# Patient Record
Sex: Male | Born: 1950 | Race: White | Hispanic: No | Marital: Married | Smoking: Never smoker
Health system: Southern US, Community
[De-identification: ages and names within clinical notes are randomized; demographics above are authoritative.]

## PROBLEM LIST (undated history)

## (undated) DIAGNOSIS — E785 Hyperlipidemia, unspecified: Secondary | ICD-10-CM

## (undated) DIAGNOSIS — C801 Malignant (primary) neoplasm, unspecified: Secondary | ICD-10-CM

## (undated) DIAGNOSIS — J069 Acute upper respiratory infection, unspecified: Secondary | ICD-10-CM

## (undated) DIAGNOSIS — M1990 Unspecified osteoarthritis, unspecified site: Secondary | ICD-10-CM

## (undated) DIAGNOSIS — M109 Gout, unspecified: Secondary | ICD-10-CM

## (undated) DIAGNOSIS — G4730 Sleep apnea, unspecified: Secondary | ICD-10-CM

## (undated) DIAGNOSIS — E669 Obesity, unspecified: Secondary | ICD-10-CM

## (undated) DIAGNOSIS — N400 Enlarged prostate without lower urinary tract symptoms: Secondary | ICD-10-CM

## (undated) DIAGNOSIS — L500 Allergic urticaria: Secondary | ICD-10-CM

## (undated) DIAGNOSIS — K5190 Ulcerative colitis, unspecified, without complications: Secondary | ICD-10-CM

## (undated) DIAGNOSIS — Z87442 Personal history of urinary calculi: Secondary | ICD-10-CM

## (undated) DIAGNOSIS — R17 Unspecified jaundice: Secondary | ICD-10-CM

## (undated) DIAGNOSIS — I10 Essential (primary) hypertension: Secondary | ICD-10-CM

## (undated) DIAGNOSIS — N529 Male erectile dysfunction, unspecified: Secondary | ICD-10-CM

## (undated) DIAGNOSIS — K429 Umbilical hernia without obstruction or gangrene: Secondary | ICD-10-CM

## (undated) HISTORY — PX: TONSILLECTOMY: SUR1361

## (undated) HISTORY — DX: Allergic urticaria: L50.0

## (undated) HISTORY — PX: SINOSCOPY: SHX187

## (undated) HISTORY — DX: Acute upper respiratory infection, unspecified: J06.9

## (undated) HISTORY — PX: OTHER SURGICAL HISTORY: SHX169

---

## 1999-04-04 ENCOUNTER — Encounter: Admission: RE | Admit: 1999-04-04 | Discharge: 1999-05-07 | Payer: Self-pay | Admitting: Emergency Medicine

## 1999-04-23 ENCOUNTER — Encounter: Payer: Self-pay | Admitting: Emergency Medicine

## 1999-04-23 ENCOUNTER — Encounter: Admission: RE | Admit: 1999-04-23 | Discharge: 1999-04-23 | Payer: Self-pay | Admitting: Emergency Medicine

## 2000-07-09 ENCOUNTER — Ambulatory Visit (HOSPITAL_COMMUNITY): Admission: RE | Admit: 2000-07-09 | Discharge: 2000-07-09 | Payer: Self-pay | Admitting: Gastroenterology

## 2002-02-06 ENCOUNTER — Encounter: Payer: Self-pay | Admitting: Emergency Medicine

## 2002-02-06 ENCOUNTER — Emergency Department (HOSPITAL_COMMUNITY): Admission: EM | Admit: 2002-02-06 | Discharge: 2002-02-06 | Payer: Self-pay | Admitting: Emergency Medicine

## 2002-06-10 ENCOUNTER — Ambulatory Visit (HOSPITAL_BASED_OUTPATIENT_CLINIC_OR_DEPARTMENT_OTHER): Admission: RE | Admit: 2002-06-10 | Discharge: 2002-06-10 | Payer: Self-pay | Admitting: Emergency Medicine

## 2002-08-15 ENCOUNTER — Ambulatory Visit (HOSPITAL_COMMUNITY): Admission: RE | Admit: 2002-08-15 | Discharge: 2002-08-15 | Payer: Self-pay | Admitting: Gastroenterology

## 2003-09-04 ENCOUNTER — Ambulatory Visit (HOSPITAL_COMMUNITY): Admission: RE | Admit: 2003-09-04 | Discharge: 2003-09-04 | Payer: Self-pay | Admitting: Gastroenterology

## 2004-01-01 ENCOUNTER — Emergency Department (HOSPITAL_COMMUNITY): Admission: EM | Admit: 2004-01-01 | Discharge: 2004-01-01 | Payer: Self-pay | Admitting: Emergency Medicine

## 2004-05-17 ENCOUNTER — Ambulatory Visit: Payer: Self-pay | Admitting: *Deleted

## 2004-05-20 ENCOUNTER — Ambulatory Visit (HOSPITAL_COMMUNITY): Admission: RE | Admit: 2004-05-20 | Discharge: 2004-05-21 | Payer: Self-pay | Admitting: Otolaryngology

## 2004-06-05 ENCOUNTER — Ambulatory Visit: Payer: Self-pay | Admitting: Pulmonary Disease

## 2004-06-06 ENCOUNTER — Ambulatory Visit: Payer: Self-pay

## 2004-06-17 ENCOUNTER — Ambulatory Visit: Payer: Self-pay | Admitting: *Deleted

## 2004-10-21 ENCOUNTER — Ambulatory Visit (HOSPITAL_COMMUNITY): Admission: RE | Admit: 2004-10-21 | Discharge: 2004-10-21 | Payer: Self-pay | Admitting: Gastroenterology

## 2012-12-20 ENCOUNTER — Other Ambulatory Visit: Payer: Self-pay | Admitting: Gastroenterology

## 2013-02-15 ENCOUNTER — Ambulatory Visit (HOSPITAL_COMMUNITY)
Admission: RE | Admit: 2013-02-15 | Discharge: 2013-02-15 | Disposition: A | Discharge status: Home / Self Care | Payer: 59 | Source: Ambulatory Visit | Attending: Gastroenterology | Admitting: Gastroenterology

## 2013-02-15 ENCOUNTER — Encounter (HOSPITAL_COMMUNITY)
Admission: RE | Disposition: A | Discharge status: Home / Self Care | Payer: Self-pay | Source: Ambulatory Visit | Attending: Gastroenterology

## 2013-02-15 ENCOUNTER — Encounter (HOSPITAL_COMMUNITY): Payer: Self-pay

## 2013-02-15 DIAGNOSIS — E7800 Pure hypercholesterolemia, unspecified: Secondary | ICD-10-CM | POA: Insufficient documentation

## 2013-02-15 DIAGNOSIS — N400 Enlarged prostate without lower urinary tract symptoms: Secondary | ICD-10-CM | POA: Insufficient documentation

## 2013-02-15 DIAGNOSIS — K5100 Ulcerative (chronic) pancolitis without complications: Secondary | ICD-10-CM | POA: Insufficient documentation

## 2013-02-15 DIAGNOSIS — I10 Essential (primary) hypertension: Secondary | ICD-10-CM | POA: Insufficient documentation

## 2013-02-15 DIAGNOSIS — K5730 Diverticulosis of large intestine without perforation or abscess without bleeding: Secondary | ICD-10-CM | POA: Insufficient documentation

## 2013-02-15 DIAGNOSIS — M109 Gout, unspecified: Secondary | ICD-10-CM | POA: Insufficient documentation

## 2013-02-15 DIAGNOSIS — D126 Benign neoplasm of colon, unspecified: Secondary | ICD-10-CM | POA: Insufficient documentation

## 2013-02-15 DIAGNOSIS — D371 Neoplasm of uncertain behavior of stomach: Secondary | ICD-10-CM | POA: Insufficient documentation

## 2013-02-15 HISTORY — PX: COLONOSCOPY: SHX5424

## 2013-02-15 HISTORY — DX: Male erectile dysfunction, unspecified: N52.9

## 2013-02-15 HISTORY — DX: Gout, unspecified: M10.9

## 2013-02-15 HISTORY — DX: Obesity, unspecified: E66.9

## 2013-02-15 HISTORY — DX: Hyperlipidemia, unspecified: E78.5

## 2013-02-15 HISTORY — DX: Ulcerative colitis, unspecified, without complications: K51.90

## 2013-02-15 HISTORY — DX: Essential (primary) hypertension: I10

## 2013-02-15 HISTORY — DX: Benign prostatic hyperplasia without lower urinary tract symptoms: N40.0

## 2013-02-15 SURGERY — COLONOSCOPY
Anesthesia: Moderate Sedation

## 2013-02-15 MED ORDER — MIDAZOLAM HCL 10 MG/2ML IJ SOLN
INTRAMUSCULAR | Status: AC
  Filled 2013-02-15: qty 2

## 2013-02-15 MED ORDER — MIDAZOLAM HCL 5 MG/5ML IJ SOLN
INTRAMUSCULAR | Status: DC | PRN
  Administered 2013-02-15 (×2): 2.5 mg via INTRAVENOUS

## 2013-02-15 MED ORDER — FENTANYL CITRATE 0.05 MG/ML IJ SOLN
INTRAMUSCULAR | Status: DC | PRN
  Administered 2013-02-15: 25 ug via INTRAVENOUS
  Administered 2013-02-15: 50 ug via INTRAVENOUS

## 2013-02-15 MED ORDER — FENTANYL CITRATE 0.05 MG/ML IJ SOLN
INTRAMUSCULAR | Status: AC
  Filled 2013-02-15: qty 2

## 2013-02-15 NOTE — H&P (Signed)
  Problem: Universal ulcerative colitis since age 62.  History: The patient is a 62 year old male born 08-Nov-1950. 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 His disease has remained in remission for years. He underwent a normal surveillance colonoscopies in 2008, 2009, and 2010.  Patient is scheduled to undergo a surveillance colonoscopy with performance of surveillance mucosal biopsies to rule out mucosal dysplasia.  Allergies: Vytorin causes muscle aches associated with an increase in the CPK  Past medical history: Universal ulcerative colitis since age 15. Hypertension. Gout. Hypercholesterolemia. Benign prostatic hypertrophy. Seborrheic dermatitis. Deviated nasal septum surgery. Tonsillectomy.  Exam: The patient is alert and lying comfortably on the endoscopy stretcher. Lungs are clear to auscultation. Cardiac exam reveals a regular rhythm. Abdomen is soft and nontender to palpation  Plan: Proceed with surveillance colonoscopy.

## 2013-02-15 NOTE — Op Note (Signed)
Problem: Universal ulcerative colitis diagnosed at age 62  Endoscopist: Danise Edge  Premedication: Versed 5 mg. Fentanyl 75 mcg.  Procedure: Surveillance colonoscopy The patient was placed in the left lateral decubitus position. Anal inspection and digital rectal exam were normal. The Pentax pediatric colonoscope was introduced into the rectum and easily advanced to the cecum. A normal-appearing ileocecal valve and appendiceal orifice were identified. Colonic preparation for the exam today was good.  There was universal colonic diverticulosis without signs of diverticulitis  Rectum. Normal. Retroflexed view of the distal rectum normal.  Sigmoid colon and descending colon. Normal.  Splenic flexure. Normal.  Transverse colon. Normal.  Hepatic flexure. Normal.  Ascending colon. Five 3 mm sized sessile polyps were removed with the cold biopsy forceps and submitted in one bottle for pathological evaluation.  Cecum and ileocecal valve. Normal.  Surveillance colonic biopsies. A total of 32 biopsies were performed along the length of the colon to look for mucosal dysplasia.  Assessment:  #1. Inactive universal ulcerative colitis   #2. Universal colonic diverticulosis  #3. Five 3 mm sized sessile polyps were removed from the ascending colon  #4. Random colon biopsies to rule out mucosal dysplasia performed

## 2013-02-16 ENCOUNTER — Encounter (HOSPITAL_COMMUNITY): Payer: Self-pay | Admitting: Gastroenterology

## 2017-06-22 DIAGNOSIS — M109 Gout, unspecified: Secondary | ICD-10-CM | POA: Diagnosis present

## 2017-06-22 DIAGNOSIS — M1611 Unilateral primary osteoarthritis, right hip: Secondary | ICD-10-CM | POA: Diagnosis present

## 2017-06-22 DIAGNOSIS — M19011 Primary osteoarthritis, right shoulder: Secondary | ICD-10-CM | POA: Diagnosis present

## 2017-06-22 DIAGNOSIS — I10 Essential (primary) hypertension: Secondary | ICD-10-CM | POA: Diagnosis present

## 2017-06-22 DIAGNOSIS — M1711 Unilateral primary osteoarthritis, right knee: Secondary | ICD-10-CM | POA: Diagnosis present

## 2017-06-22 NOTE — H&P (Signed)
PREOPERATIVE H&P Patient ID: Joseph Massey MRN: [id number] DOB/AGE: [birth year] 67 y.o.  Chief Complaint: OA RIGHT HIP  Planned Procedure Date: 07/14/2017 Medical Clearance by Dr. Jacelyn Grip pending Cardiac Clearance by Dr. Wynonia Lawman pending   HPI: Joseph Massey is a 67 y.o. male with a remote history of ulcerative colitis.  History of OSA on CPAP, gout, HTN, severe right shoulder OA, right knee OA.  He presents today for evaluation of OA RIGHT HIP. The patient has a history of pain and functional disability in the right hip due to arthritis and has failed non-surgical conservative treatments for greater than 12 weeks to include NSAID's and/or analgesics, corticosteriod injections, use of assistive devices and activity modification.  Onset of symptoms was gradual, starting 1 years ago with rapidlly worsening course since that time.  Patient currently rates pain at 8 out of 10 with activity. Patient has night pain, worsening of pain with activity and weight bearing and pain that interferes with activities of daily living.  Patient has evidence of subchondral cysts, subchondral sclerosis, periarticular osteophytes and joint space narrowing by imaging studies.  There is no active infection.  Past Medical History:  Diagnosis Date  . BPH (benign prostatic hyperplasia)   . ED (erectile dysfunction)   . Gout   . Hyperlipidemia   . Hypertension   . Obesity   . UC (ulcerative colitis)    Past Surgical History:  Procedure Laterality Date  . COLONOSCOPY N/A 02/15/2013   Procedure: COLONOSCOPY;  Surgeon: Garlan Fair, MD;  Location: [facility];  Service: Endoscopy;  Laterality: N/A;   Allergies  Allergen Reactions  . Vytorin [Ezetimibe-Simvastatin]    Medications: Lisinopril/HCTZ 20-12.5 mg daily Allopurinol 300 mg daily. Doxazosin 1 daily. Ibuprofen 800 mg up to 3 times daily as needed Pepcid AC daily  Social history: Married traveling Merchandiser, retail.  Never smoker.  Daily EtOH-2-3 drinks  per day.  Family history: Mother with cancer.  Father with heart disease, MI, DM.  Grandparents with cancer.  ROS: Currently denies lightheadedness, dizziness, Fever, chills, CP, SOB.   No personal history of DVT, PE, MI, or CVA. No loose teeth or dentures All other systems have been reviewed and were otherwise currently negative with the exception of those mentioned in the HPI and as above.  Objective: Vitals: Ht: 5 feet 8-1/2 inches wt: 237 temp: 97.8 BP: 124/71 pulse: 68 O2 97 % on room air. Physical Exam: General: Alert, NAD.  Trendelenberg Gait  HEENT: EOMI, Good Neck Extension  Pulm: No increased work of breathing.  Clear B/L A/P w/o crackle or wheeze.  CV: RRR, No m/g/r appreciated  GI: soft, NT, ND Neuro: Neuro without gross focal deficit.  Sensation intact distally Skin: No lesions in the area of chief complaint MSK/Surgical Site: Right hip Non tender over greater trochanter.  Pain with passive ROM.  Positive Stinchfield.  5/5 strength.  NVI.  Sensation intact distally.   Imaging Review Plain radiographs demonstrate severe degenerative joint disease of the right hip.   Assessment: OA RIGHT HIP Principal Problem:   Primary osteoarthritis of right hip Active Problems:   Primary osteoarthritis, right shoulder   Primary osteoarthritis of right knee   Gout   Essential hypertension   Plan: Plan for Procedure(s): TOTAL HIP ARTHROPLASTY ANTERIOR APPROACH  The patient history, physical exam, clinical judgement of the provider and imaging are consistent with end stage degenerative joint disease and total joint arthroplasty is deemed medically necessary. The treatment options including medical management, injection therapy, and  arthroplasty were discussed at length. The risks and benefits of Procedure(s): TOTAL HIP ARTHROPLASTY ANTERIOR APPROACH were presented and reviewed.  The risks of nonoperative treatment, versus surgical intervention including but not limited to  continued pain, aseptic loosening, stiffness, dislocation/subluxation, infection, bleeding, nerve injury, blood clots, cardiopulmonary complications, morbidity, mortality, among others were discussed. The patient verbalizes understanding and wishes to proceed with the plan.  Patient is being admitted for inpatient treatment for surgery, pain control, PT, OT, prophylactic antibiotics, VTE prophylaxis, progressive ambulation, ADL's and discharge planning.   Dental prophylaxis discussed and recommended for 2 years postoperatively.   The patient does meet the criteria for TXA which will be used perioperatively.    ASA 325 mg will be used postoperatively for DVT prophylaxis in addition to SCDs, and early ambulation.  Will make Rx for Celebrex / gastroprotection post op.  The patient is planning to be discharged home with home health services ([facility]) in care of his wife.   Prudencio Burly III, PA-C 06/22/2017 6:24 PM

## 2017-07-01 ENCOUNTER — Other Ambulatory Visit (HOSPITAL_COMMUNITY): Payer: Self-pay

## 2017-07-02 NOTE — Pre-Procedure Instructions (Signed)
Joseph Massey  07/02/2017      [id number] - [place], [place] [address] [place] [address] Phone: [phone] Fax: [phone]    Your procedure is scheduled on Tuesday, July 14, 2017  Report to [facility] Admitting Entrance "A" at 10:00AM   Call this number if you have problems the morning of surgery:  [phone]   Remember:  Do not eat food or drink liquids after midnight.  Take these medicines the morning of surgery with A SIP OF WATER:  Allopurinol (ZYLOPRIM) and Doxazosin (CARDURA). If needed Acetaminophen (TYLENOL) for pain.  7 days before surgery (Feb. 5), stop taking all Aspirins, Vitamins, Fish oils, and Herbal medications. Also stop all NSAIDS i.e. Advil, Ibuprofen, Motrin, Aleve, Anaprox, Naproxen, BC and Goody Powders.   Do not wear jewelry.  Do not wear lotions, powders, colognes, or deodorant.  Do not shave 48 hours prior to surgery.  Men may shave face and neck.  Do not bring valuables to the hospital.  [facility] is not responsible for any belongings or valuables.  Contacts, dentures or bridgework may not be worn into surgery.  Leave your suitcase in the car.  After surgery it may be brought to your room.  For patients admitted to the hospital, discharge time will be determined by your treatment team.  Patients discharged the day of surgery will not be allowed to drive home.   Special instructions:   [place]- Preparing For Surgery  Before surgery, you can play an important role. Because skin is not sterile, your skin needs to be as free of germs as possible. You can reduce the number of germs on your skin by washing with CHG (chlorahexidine gluconate) Soap before surgery.  CHG is an antiseptic cleaner which kills germs and bonds with the skin to continue killing germs even after washing.  Please do not use if you have an allergy to CHG or antibacterial soaps. If your skin becomes reddened/irritated  stop using the CHG.  Do not shave (including legs and underarms) for at least 48 hours prior to first CHG shower. It is OK to shave your face.  Please follow these instructions carefully.   1. Shower the NIGHT BEFORE SURGERY and the MORNING OF SURGERY with CHG.   2. If you chose to wash your hair, wash your hair first as usual with your normal shampoo.  3. After you shampoo, rinse your hair and body thoroughly to remove the shampoo.  4. Use CHG as you would any other liquid soap. You can apply CHG directly to the skin and wash gently with a scrungie or a clean washcloth.   5. Apply the CHG Soap to your body ONLY FROM THE NECK DOWN.  Do not use on open wounds or open sores. Avoid contact with your eyes, ears, mouth and genitals (private parts). Wash Face and genitals (private parts)  with your normal soap.  6. Wash thoroughly, paying special attention to the area where your surgery will be performed.  7. Thoroughly rinse your body with warm water from the neck down.  8. DO NOT shower/wash with your normal soap after using and rinsing off the CHG Soap.  9. Pat yourself dry with a CLEAN TOWEL.  10. Wear CLEAN PAJAMAS to bed the night before surgery, wear comfortable clothes the morning of surgery  11. Place CLEAN SHEETS on your bed the night of your first shower and DO NOT SLEEP WITH PETS.  Day of Surgery: Do not apply any deodorants/lotions. Please wear clean clothes to the hospital/surgery center.    Please read over the following fact sheets that you were given. Pain Booklet, Coughing and Deep Breathing, Total Joint Packet, MRSA Information and Surgical Site Infection Prevention

## 2017-07-03 ENCOUNTER — Encounter (HOSPITAL_COMMUNITY): Payer: Self-pay

## 2017-07-03 ENCOUNTER — Other Ambulatory Visit: Payer: Self-pay

## 2017-07-03 ENCOUNTER — Encounter (HOSPITAL_COMMUNITY)
Admission: RE | Admit: 2017-07-03 | Discharge: 2017-07-03 | Disposition: A | Discharge status: Home / Self Care | Payer: Medicare Other | Source: Ambulatory Visit | Attending: Orthopedic Surgery | Admitting: Orthopedic Surgery

## 2017-07-03 DIAGNOSIS — Z01818 Encounter for other preprocedural examination: Secondary | ICD-10-CM | POA: Diagnosis not present

## 2017-07-03 DIAGNOSIS — M1611 Unilateral primary osteoarthritis, right hip: Secondary | ICD-10-CM | POA: Insufficient documentation

## 2017-07-03 HISTORY — DX: Sleep apnea, unspecified: G47.30

## 2017-07-03 HISTORY — DX: Personal history of urinary calculi: Z87.442

## 2017-07-03 HISTORY — DX: Unspecified osteoarthritis, unspecified site: M19.90

## 2017-07-03 LAB — BASIC METABOLIC PANEL
Anion gap: 9 (ref 5–15)
BUN: 13 mg/dL (ref 6–20)
CALCIUM: 9.1 mg/dL (ref 8.9–10.3)
CO2: 25 mmol/L (ref 22–32)
CREATININE: 0.72 mg/dL (ref 0.61–1.24)
Chloride: 103 mmol/L (ref 101–111)
Glucose, Bld: 102 mg/dL — ABNORMAL HIGH (ref 65–99)
Potassium: 4 mmol/L (ref 3.5–5.1)
SODIUM: 137 mmol/L (ref 135–145)

## 2017-07-03 LAB — SURGICAL PCR SCREEN
MRSA, PCR: NEGATIVE
STAPHYLOCOCCUS AUREUS: NEGATIVE

## 2017-07-03 LAB — CBC
HCT: 41.1 % (ref 39.0–52.0)
Hemoglobin: 14 g/dL (ref 13.0–17.0)
MCH: 32 pg (ref 26.0–34.0)
MCHC: 34.1 g/dL (ref 30.0–36.0)
MCV: 94.1 fL (ref 78.0–100.0)
PLATELETS: 228 10*3/uL (ref 150–400)
RBC: 4.37 MIL/uL (ref 4.22–5.81)
RDW: 14.2 % (ref 11.5–15.5)
WBC: 5.6 10*3/uL (ref 4.0–10.5)

## 2017-07-03 NOTE — Progress Notes (Signed)
PCP: Dr. Jacelyn Grip @ [facility]  [place] Cardiologist: Dr. Wynonia Lawman

## 2017-07-06 NOTE — Progress Notes (Addendum)
Anesthesia Chart Review:  Pt is a 67 year old male scheduled for R total hip arthroplasty anterior approach on 07/14/2017 with Edmonia Lynch, MD  - Receives primary care at [facility].  Last office visit 04/03/17 with Dineen Kid, MD - Cardiologist is Tollie Eth, MD. Last office visit 05/08/17  PMH includes:  HTN, hyperlipidemia, OSA, ulcerative colitis. Never smoker. BMI 33  Medications include: cardura, lisinopril-hctz  BP 126/69   Pulse 71   Temp 36.6 C   Resp 20   Ht 5\' 11"  (1.803 m)   Wt 238 lb 6.4 oz (108.1 kg)   SpO2 95%   BMI 33.25 kg/m   Preoperative labs reviewed.    EKG 05/08/17 (Dr. Thurman Coyer office): First-degree AV block, occasional PVCs, nonspecific ST changes, previous septal infarction.  Echo 05/22/17 (Dr. Thurman Coyer office): 1.  Mild concentric LVH with normal global wall motion.  Estimated EF 65%. 2.  Mild mitral regurgitation. 3.  Trace tricuspid regurgitation. 4.  IVC is dilated with respiratory variation  If no changes, I anticipate pt can proceed with surgery as scheduled.   Willeen Cass, FNP-BC [facility] Short Stay Surgical Center/Anesthesiology Phone: [phone] 07/07/2017 4:35 PM

## 2017-07-13 MED ORDER — BUPIVACAINE LIPOSOME 1.3 % IJ SUSP
20.0000 mL | INTRAMUSCULAR | Status: AC
  Administered 2017-07-14: 20 mL
  Filled 2017-07-13: qty 20

## 2017-07-13 MED ORDER — TRANEXAMIC ACID 1000 MG/10ML IV SOLN
1000.0000 mg | INTRAVENOUS | Status: AC
  Administered 2017-07-14: 1000 mg via INTRAVENOUS
  Filled 2017-07-13: qty 1100

## 2017-07-13 MED ORDER — SODIUM CHLORIDE 0.9 % IV SOLN
2000.0000 mg | Freq: Once | INTRAVENOUS | Status: AC
  Administered 2017-07-14: 2000 mg via TOPICAL
  Filled 2017-07-13: qty 20

## 2017-07-14 ENCOUNTER — Inpatient Hospital Stay (HOSPITAL_COMMUNITY): Payer: Medicare Other | Admitting: Emergency Medicine

## 2017-07-14 ENCOUNTER — Encounter (HOSPITAL_COMMUNITY): Payer: Self-pay | Admitting: *Deleted

## 2017-07-14 ENCOUNTER — Encounter (HOSPITAL_COMMUNITY)
Admission: RE | Disposition: A | Discharge status: Home Health | Payer: Self-pay | Source: Ambulatory Visit | Attending: Orthopedic Surgery

## 2017-07-14 ENCOUNTER — Inpatient Hospital Stay (HOSPITAL_COMMUNITY): Payer: Medicare Other

## 2017-07-14 ENCOUNTER — Inpatient Hospital Stay (HOSPITAL_COMMUNITY): Payer: Medicare Other | Admitting: Certified Registered"

## 2017-07-14 ENCOUNTER — Inpatient Hospital Stay (HOSPITAL_COMMUNITY)
Admission: RE | Admit: 2017-07-14 | Discharge: 2017-07-15 | DRG: 470 | Disposition: A | Discharge status: Home Health | Payer: Medicare Other | Source: Ambulatory Visit | Attending: Orthopedic Surgery | Admitting: Orthopedic Surgery

## 2017-07-14 DIAGNOSIS — Z87442 Personal history of urinary calculi: Secondary | ICD-10-CM | POA: Diagnosis not present

## 2017-07-14 DIAGNOSIS — M1711 Unilateral primary osteoarthritis, right knee: Secondary | ICD-10-CM | POA: Diagnosis present

## 2017-07-14 DIAGNOSIS — M19011 Primary osteoarthritis, right shoulder: Secondary | ICD-10-CM | POA: Diagnosis present

## 2017-07-14 DIAGNOSIS — I10 Essential (primary) hypertension: Secondary | ICD-10-CM | POA: Diagnosis present

## 2017-07-14 DIAGNOSIS — N400 Enlarged prostate without lower urinary tract symptoms: Secondary | ICD-10-CM | POA: Diagnosis present

## 2017-07-14 DIAGNOSIS — G4733 Obstructive sleep apnea (adult) (pediatric): Secondary | ICD-10-CM | POA: Diagnosis present

## 2017-07-14 DIAGNOSIS — Z419 Encounter for procedure for purposes other than remedying health state, unspecified: Secondary | ICD-10-CM

## 2017-07-14 DIAGNOSIS — E785 Hyperlipidemia, unspecified: Secondary | ICD-10-CM | POA: Diagnosis present

## 2017-07-14 DIAGNOSIS — E669 Obesity, unspecified: Secondary | ICD-10-CM | POA: Diagnosis present

## 2017-07-14 DIAGNOSIS — Z79899 Other long term (current) drug therapy: Secondary | ICD-10-CM | POA: Diagnosis not present

## 2017-07-14 DIAGNOSIS — M1611 Unilateral primary osteoarthritis, right hip: Principal | ICD-10-CM | POA: Diagnosis present

## 2017-07-14 DIAGNOSIS — Z6832 Body mass index (BMI) 32.0-32.9, adult: Secondary | ICD-10-CM

## 2017-07-14 DIAGNOSIS — M109 Gout, unspecified: Secondary | ICD-10-CM | POA: Diagnosis present

## 2017-07-14 DIAGNOSIS — M25551 Pain in right hip: Secondary | ICD-10-CM | POA: Diagnosis present

## 2017-07-14 HISTORY — PX: TOTAL HIP ARTHROPLASTY: SHX124

## 2017-07-14 SURGERY — ARTHROPLASTY, HIP, TOTAL, ANTERIOR APPROACH
Anesthesia: Spinal | Site: Hip | Laterality: Right

## 2017-07-14 MED ORDER — MIDAZOLAM HCL 2 MG/2ML IJ SOLN
INTRAMUSCULAR | Status: AC
  Filled 2017-07-14: qty 2

## 2017-07-14 MED ORDER — CHLORHEXIDINE GLUCONATE 4 % EX LIQD: 60.0000 mL | Freq: Once | CUTANEOUS | Status: DC

## 2017-07-14 MED ORDER — DEXAMETHASONE SODIUM PHOSPHATE 10 MG/ML IJ SOLN
INTRAMUSCULAR | Status: DC | PRN
  Administered 2017-07-14: 10 mg via INTRAVENOUS

## 2017-07-14 MED ORDER — METOCLOPRAMIDE HCL 5 MG PO TABS: 5.0000 mg | ORAL_TABLET | Freq: Three times a day (TID) | ORAL | Status: DC | PRN

## 2017-07-14 MED ORDER — METOCLOPRAMIDE HCL 5 MG/ML IJ SOLN: 5.0000 mg | Freq: Three times a day (TID) | INTRAMUSCULAR | Status: DC | PRN

## 2017-07-14 MED ORDER — ONDANSETRON HCL 4 MG PO TABS: 4.0000 mg | ORAL_TABLET | Freq: Four times a day (QID) | ORAL | Status: DC | PRN

## 2017-07-14 MED ORDER — ACETAMINOPHEN 325 MG PO TABS
650.0000 mg | ORAL_TABLET | ORAL | Status: DC | PRN
  Administered 2017-07-14: 650 mg via ORAL
  Filled 2017-07-14: qty 2

## 2017-07-14 MED ORDER — SODIUM CHLORIDE FLUSH 0.9 % IV SOLN
INTRAVENOUS | Status: DC | PRN
  Administered 2017-07-14: 50 mL

## 2017-07-14 MED ORDER — ACETAMINOPHEN 500 MG PO TABS
1000.0000 mg | ORAL_TABLET | Freq: Once | ORAL | Status: DC
  Filled 2017-07-14: qty 2

## 2017-07-14 MED ORDER — PHENYLEPHRINE 40 MCG/ML (10ML) SYRINGE FOR IV PUSH (FOR BLOOD PRESSURE SUPPORT)
PREFILLED_SYRINGE | INTRAVENOUS | Status: AC
  Filled 2017-07-14: qty 10

## 2017-07-14 MED ORDER — DOCUSATE SODIUM 100 MG PO CAPS: 100.0000 mg | ORAL_CAPSULE | Freq: Two times a day (BID) | ORAL | 0 refills | Status: DC

## 2017-07-14 MED ORDER — LACTATED RINGERS IV SOLN
INTRAVENOUS | Status: DC
  Administered 2017-07-14: 17:00:00 via INTRAVENOUS

## 2017-07-14 MED ORDER — ALLOPURINOL 300 MG PO TABS
300.0000 mg | ORAL_TABLET | Freq: Every day | ORAL | Status: DC
  Administered 2017-07-15: 300 mg via ORAL
  Filled 2017-07-14: qty 1

## 2017-07-14 MED ORDER — DEXAMETHASONE SODIUM PHOSPHATE 10 MG/ML IJ SOLN
10.0000 mg | Freq: Once | INTRAMUSCULAR | Status: AC
  Administered 2017-07-15: 10 mg via INTRAVENOUS
  Filled 2017-07-14: qty 1

## 2017-07-14 MED ORDER — HYDROCODONE-ACETAMINOPHEN 5-325 MG PO TABS: 1.0000 | ORAL_TABLET | ORAL | 0 refills | Status: DC | PRN

## 2017-07-14 MED ORDER — PHENYLEPHRINE HCL 10 MG/ML IJ SOLN
INTRAVENOUS | Status: DC | PRN
  Administered 2017-07-14: 20 ug/min via INTRAVENOUS

## 2017-07-14 MED ORDER — CELECOXIB 200 MG PO CAPS: 200.0000 mg | ORAL_CAPSULE | Freq: Two times a day (BID) | ORAL | 0 refills | Status: DC

## 2017-07-14 MED ORDER — GABAPENTIN 300 MG PO CAPS
300.0000 mg | ORAL_CAPSULE | Freq: Once | ORAL | Status: AC
  Administered 2017-07-14: 300 mg via ORAL
  Filled 2017-07-14: qty 1

## 2017-07-14 MED ORDER — 0.9 % SODIUM CHLORIDE (POUR BTL) OPTIME
TOPICAL | Status: DC | PRN
  Administered 2017-07-14: 1000 mL

## 2017-07-14 MED ORDER — SORBITOL 70 % SOLN: 30.0000 mL | Freq: Every day | Status: DC | PRN

## 2017-07-14 MED ORDER — METHOCARBAMOL 1000 MG/10ML IJ SOLN
500.0000 mg | Freq: Four times a day (QID) | INTRAVENOUS | Status: DC | PRN
  Filled 2017-07-14: qty 5

## 2017-07-14 MED ORDER — ASPIRIN EC 325 MG PO TBEC
325.0000 mg | DELAYED_RELEASE_TABLET | Freq: Every day | ORAL | Status: DC
  Administered 2017-07-15: 325 mg via ORAL
  Filled 2017-07-14: qty 1

## 2017-07-14 MED ORDER — FENTANYL CITRATE (PF) 250 MCG/5ML IJ SOLN
INTRAMUSCULAR | Status: AC
  Filled 2017-07-14: qty 5

## 2017-07-14 MED ORDER — HYDROCODONE-ACETAMINOPHEN 5-325 MG PO TABS
2.0000 | ORAL_TABLET | ORAL | Status: DC | PRN
  Administered 2017-07-14 – 2017-07-15 (×4): 2 via ORAL
  Filled 2017-07-14 (×4): qty 2

## 2017-07-14 MED ORDER — ONDANSETRON HCL 4 MG/2ML IJ SOLN
INTRAMUSCULAR | Status: DC | PRN
  Administered 2017-07-14: 4 mg via INTRAVENOUS

## 2017-07-14 MED ORDER — PROPOFOL 10 MG/ML IV BOLUS
INTRAVENOUS | Status: DC | PRN
  Administered 2017-07-14 (×3): 20 mg via INTRAVENOUS

## 2017-07-14 MED ORDER — LISINOPRIL-HYDROCHLOROTHIAZIDE 20-12.5 MG PO TABS: 1.0000 | ORAL_TABLET | Freq: Every day | ORAL | Status: DC

## 2017-07-14 MED ORDER — FENTANYL CITRATE (PF) 100 MCG/2ML IJ SOLN: 25.0000 ug | INTRAMUSCULAR | Status: DC | PRN

## 2017-07-14 MED ORDER — CEFAZOLIN SODIUM-DEXTROSE 1-4 GM/50ML-% IV SOLN
1.0000 g | Freq: Four times a day (QID) | INTRAVENOUS | Status: AC
  Administered 2017-07-14 (×2): 1 g via INTRAVENOUS
  Filled 2017-07-14 (×2): qty 50

## 2017-07-14 MED ORDER — PHENYLEPHRINE 40 MCG/ML (10ML) SYRINGE FOR IV PUSH (FOR BLOOD PRESSURE SUPPORT)
PREFILLED_SYRINGE | INTRAVENOUS | Status: DC | PRN
  Administered 2017-07-14: 80 ug via INTRAVENOUS

## 2017-07-14 MED ORDER — FLEET ENEMA 7-19 GM/118ML RE ENEM: 1.0000 | ENEMA | Freq: Once | RECTAL | Status: DC | PRN

## 2017-07-14 MED ORDER — HYDROMORPHONE HCL 1 MG/ML IJ SOLN: 0.5000 mg | INTRAMUSCULAR | Status: DC | PRN

## 2017-07-14 MED ORDER — DOXAZOSIN MESYLATE 1 MG PO TABS
1.0000 mg | ORAL_TABLET | Freq: Every day | ORAL | Status: DC
  Administered 2017-07-15: 1 mg via ORAL
  Filled 2017-07-14: qty 1

## 2017-07-14 MED ORDER — FENTANYL CITRATE (PF) 250 MCG/5ML IJ SOLN
INTRAMUSCULAR | Status: DC | PRN
  Administered 2017-07-14: 50 ug via INTRAVENOUS

## 2017-07-14 MED ORDER — ONDANSETRON HCL 4 MG/2ML IJ SOLN: 4.0000 mg | Freq: Four times a day (QID) | INTRAMUSCULAR | Status: DC | PRN

## 2017-07-14 MED ORDER — DEXAMETHASONE SODIUM PHOSPHATE 10 MG/ML IJ SOLN
INTRAMUSCULAR | Status: AC
  Filled 2017-07-14: qty 1

## 2017-07-14 MED ORDER — PROPOFOL 500 MG/50ML IV EMUL
INTRAVENOUS | Status: DC | PRN
  Administered 2017-07-14: 100 ug/kg/min via INTRAVENOUS

## 2017-07-14 MED ORDER — OMEPRAZOLE 20 MG PO CPDR: 20.0000 mg | DELAYED_RELEASE_CAPSULE | Freq: Every day | ORAL | 0 refills | Status: DC

## 2017-07-14 MED ORDER — HYDROCODONE-ACETAMINOPHEN 5-325 MG PO TABS: 1.0000 | ORAL_TABLET | ORAL | Status: DC | PRN

## 2017-07-14 MED ORDER — DOCUSATE SODIUM 100 MG PO CAPS
100.0000 mg | ORAL_CAPSULE | Freq: Two times a day (BID) | ORAL | Status: DC
  Administered 2017-07-14 – 2017-07-15 (×2): 100 mg via ORAL
  Filled 2017-07-14 (×3): qty 1

## 2017-07-14 MED ORDER — CELECOXIB 200 MG PO CAPS
200.0000 mg | ORAL_CAPSULE | Freq: Two times a day (BID) | ORAL | Status: DC
  Administered 2017-07-14 – 2017-07-15 (×2): 200 mg via ORAL
  Filled 2017-07-14 (×2): qty 1

## 2017-07-14 MED ORDER — PHENOL 1.4 % MT LIQD
1.0000 | OROMUCOSAL | Status: DC | PRN
Start: 2017-07-14 — End: 2017-07-15

## 2017-07-14 MED ORDER — METHOCARBAMOL 500 MG PO TABS
500.0000 mg | ORAL_TABLET | Freq: Four times a day (QID) | ORAL | Status: DC | PRN
  Administered 2017-07-14 – 2017-07-15 (×2): 500 mg via ORAL
  Filled 2017-07-14 (×2): qty 1

## 2017-07-14 MED ORDER — POLYETHYLENE GLYCOL 3350 17 G PO PACK: 17.0000 g | PACK | Freq: Every day | ORAL | Status: DC | PRN

## 2017-07-14 MED ORDER — DIPHENHYDRAMINE HCL 12.5 MG/5ML PO ELIX: 12.5000 mg | ORAL_SOLUTION | ORAL | Status: DC | PRN

## 2017-07-14 MED ORDER — ONDANSETRON HCL 4 MG PO TABS: 4.0000 mg | ORAL_TABLET | Freq: Three times a day (TID) | ORAL | 0 refills | Status: DC | PRN

## 2017-07-14 MED ORDER — ASPIRIN EC 325 MG PO TBEC: 325.0000 mg | DELAYED_RELEASE_TABLET | Freq: Every day | ORAL | 0 refills | Status: DC

## 2017-07-14 MED ORDER — HYDROCHLOROTHIAZIDE 12.5 MG PO CAPS
12.5000 mg | ORAL_CAPSULE | Freq: Every day | ORAL | Status: DC
  Administered 2017-07-14 – 2017-07-15 (×2): 12.5 mg via ORAL
  Filled 2017-07-14 (×2): qty 1

## 2017-07-14 MED ORDER — CEFAZOLIN SODIUM-DEXTROSE 2-4 GM/100ML-% IV SOLN
2.0000 g | INTRAVENOUS | Status: AC
  Administered 2017-07-14: 2 g via INTRAVENOUS
  Filled 2017-07-14: qty 100

## 2017-07-14 MED ORDER — LACTATED RINGERS IV SOLN
INTRAVENOUS | Status: DC
  Administered 2017-07-14 (×2): via INTRAVENOUS

## 2017-07-14 MED ORDER — ONDANSETRON HCL 4 MG/2ML IJ SOLN
INTRAMUSCULAR | Status: AC
  Filled 2017-07-14: qty 2

## 2017-07-14 MED ORDER — LISINOPRIL 20 MG PO TABS
20.0000 mg | ORAL_TABLET | Freq: Every day | ORAL | Status: DC
  Administered 2017-07-14 – 2017-07-15 (×2): 20 mg via ORAL
  Filled 2017-07-14 (×2): qty 1

## 2017-07-14 MED ORDER — BACLOFEN 10 MG PO TABS: 10.0000 mg | ORAL_TABLET | Freq: Three times a day (TID) | ORAL | 0 refills | Status: DC | PRN

## 2017-07-14 MED ORDER — MIDAZOLAM HCL 5 MG/5ML IJ SOLN
INTRAMUSCULAR | Status: DC | PRN
  Administered 2017-07-14: 2 mg via INTRAVENOUS

## 2017-07-14 MED ORDER — MENTHOL 3 MG MT LOZG: 1.0000 | LOZENGE | OROMUCOSAL | Status: DC | PRN

## 2017-07-14 MED ORDER — ACETAMINOPHEN 650 MG RE SUPP: 650.0000 mg | RECTAL | Status: DC | PRN

## 2017-07-14 MED ORDER — SENNA 8.6 MG PO TABS
1.0000 | ORAL_TABLET | Freq: Two times a day (BID) | ORAL | Status: DC
  Administered 2017-07-15: 8.6 mg via ORAL
  Filled 2017-07-14 (×2): qty 1

## 2017-07-14 SURGICAL SUPPLY — 52 items
BAG DECANTER FOR FLEXI CONT (MISCELLANEOUS) ×3 IMPLANT
BLADE SAG 18X100X1.27 (BLADE) ×3 IMPLANT
CAPT HIP TOTAL 3 ×3 IMPLANT
CLOSURE STERI-STRIP 1/2X4 (GAUZE/BANDAGES/DRESSINGS) ×1
CLOSURE WOUND 1/2 X4 (GAUZE/BANDAGES/DRESSINGS) ×1
CLSR STERI-STRIP ANTIMIC 1/2X4 (GAUZE/BANDAGES/DRESSINGS) ×2 IMPLANT
COVER PERINEAL POST (MISCELLANEOUS) ×3 IMPLANT
COVER SURGICAL LIGHT HANDLE (MISCELLANEOUS) ×3 IMPLANT
DRAPE C-ARM 42X72 X-RAY (DRAPES) ×3 IMPLANT
DRAPE STERI IOBAN 125X83 (DRAPES) ×3 IMPLANT
DRAPE U-SHAPE 47X51 STRL (DRAPES) ×6 IMPLANT
DRSG EMULSION OIL 3X3 NADH (GAUZE/BANDAGES/DRESSINGS) ×3 IMPLANT
DRSG MEPILEX BORDER 4X8 (GAUZE/BANDAGES/DRESSINGS) ×3 IMPLANT
DURAPREP 26ML APPLICATOR (WOUND CARE) ×3 IMPLANT
ELECT BLADE 4.0 EZ CLEAN MEGAD (MISCELLANEOUS) ×3
ELECT REM PT RETURN 9FT ADLT (ELECTROSURGICAL) ×3
ELECTRODE BLDE 4.0 EZ CLN MEGD (MISCELLANEOUS) ×1 IMPLANT
ELECTRODE REM PT RTRN 9FT ADLT (ELECTROSURGICAL) ×1 IMPLANT
FACESHIELD WRAPAROUND (MASK) ×9 IMPLANT
GLOVE BIO SURGEON STRL SZ7.5 (GLOVE) ×6 IMPLANT
GLOVE BIOGEL PI IND STRL 8 (GLOVE) ×2 IMPLANT
GLOVE BIOGEL PI INDICATOR 8 (GLOVE) ×4
GOWN STRL REUS W/ TWL LRG LVL3 (GOWN DISPOSABLE) ×4 IMPLANT
GOWN STRL REUS W/TWL LRG LVL3 (GOWN DISPOSABLE) ×8
KIT BASIN OR (CUSTOM PROCEDURE TRAY) ×3 IMPLANT
KIT ROOM TURNOVER OR (KITS) ×3 IMPLANT
MANIFOLD NEPTUNE II (INSTRUMENTS) ×3 IMPLANT
NDL SAFETY ECLIPSE 18X1.5 (NEEDLE) IMPLANT
NEEDLE HYPO 18GX1.5 SHARP (NEEDLE)
NEEDLE HYPO 22GX1.5 SAFETY (NEEDLE) ×3 IMPLANT
NS IRRIG 1000ML POUR BTL (IV SOLUTION) ×3 IMPLANT
PACK TOTAL JOINT (CUSTOM PROCEDURE TRAY) ×3 IMPLANT
PACK UNIVERSAL I (CUSTOM PROCEDURE TRAY) ×3 IMPLANT
PAD ARMBOARD 7.5X6 YLW CONV (MISCELLANEOUS) ×3 IMPLANT
SPONGE LAP 18X18 X RAY DECT (DISPOSABLE) IMPLANT
STRIP CLOSURE SKIN 1/2X4 (GAUZE/BANDAGES/DRESSINGS) ×2 IMPLANT
SUT MNCRL AB 4-0 PS2 18 (SUTURE) ×3 IMPLANT
SUT MON AB 2-0 CT1 36 (SUTURE) ×3 IMPLANT
SUT VIC AB 0 CT1 27 (SUTURE) ×2
SUT VIC AB 0 CT1 27XBRD ANBCTR (SUTURE) ×1 IMPLANT
SUT VIC AB 1 CT1 27 (SUTURE) ×4
SUT VIC AB 1 CT1 27XBRD ANBCTR (SUTURE) ×2 IMPLANT
SUT VLOC 180 0 24IN GS25 (SUTURE) ×3 IMPLANT
SYR 50ML LL SCALE MARK (SYRINGE) ×3 IMPLANT
SYR BULB IRRIGATION 50ML (SYRINGE) ×3 IMPLANT
SYRINGE 20CC LL (MISCELLANEOUS) IMPLANT
TOWEL OR 17X24 6PK STRL BLUE (TOWEL DISPOSABLE) ×3 IMPLANT
TOWEL OR 17X26 10 PK STRL BLUE (TOWEL DISPOSABLE) ×3 IMPLANT
TRAY CATH 16FR W/PLASTIC CATH (SET/KITS/TRAYS/PACK) ×3 IMPLANT
TRAY FOLEY W/METER SILVER 16FR (SET/KITS/TRAYS/PACK) IMPLANT
WATER STERILE IRR 1000ML POUR (IV SOLUTION) ×3 IMPLANT
YANKAUER SUCT BULB TIP NO VENT (SUCTIONS) ×3 IMPLANT

## 2017-07-14 NOTE — Evaluation (Signed)
Physical Therapy Evaluation Patient Details Name: Joseph Massey MRN: [id number] DOB: 09/14/1950 Today's Date: 07/14/2017   History of Present Illness  Pt is a 67 y/o male s/p elective R THA, direct anterior approach. PMH includes HTN and obesity.   Clinical Impression  Pt s/p surgery above with deficits below. Pt presenting with unsteadiness and difficulty with RLE advancement secondary to decreased sensation in RLE, so distance limited to chair. Supine HEP initiated. Will continue to follow acutely to maximize functional mobility independence and safety.     Follow Up Recommendations Follow surgeon's recommendation for DC plan and follow-up therapies;Supervision for mobility/OOB    Equipment Recommendations  Rolling walker with 5" wheels;3in1 (PT)    Recommendations for Other Services       Precautions / Restrictions Precautions Precautions: Fall Restrictions Weight Bearing Restrictions: Yes RLE Weight Bearing: Weight bearing as tolerated      Mobility  Bed Mobility Overal bed mobility: Needs Assistance Bed Mobility: Supine to Sit     Supine to sit: Min guard     General bed mobility comments: Min guard for safety. Use of bed rails and elevated HOB.   Transfers Overall transfer level: Needs assistance Equipment used: Rolling walker (2 wheeled) Transfers: Sit to/from Stand Sit to Stand: Min guard         General transfer comment: Min guard for steadying assist. Verbal cues for safe hand placement.   Ambulation/Gait Ambulation/Gait assistance: Min assist Ambulation Distance (Feet): 5 Feet Assistive device: Rolling walker (2 wheeled) Gait Pattern/deviations: Step-to pattern;Decreased step length - right;Decreased step length - left;Decreased weight shift to right;Antalgic Gait velocity: Decreased  Gait velocity interpretation: Below normal speed for age/gender General Gait Details: Slow, unsteady gait. Difficulty with RLE advancement noted. Required min A for  steadying and verbal cues for sequencing using RW.   Stairs            Wheelchair Mobility    Modified Rankin (Stroke Patients Only)       Balance Overall balance assessment: Needs assistance Sitting-balance support: No upper extremity supported;Feet supported Sitting balance-Leahy Scale: Good     Standing balance support: Bilateral upper extremity supported;During functional activity Standing balance-Leahy Scale: Poor Standing balance comment: Reliant on BUE support                              Pertinent Vitals/Pain Pain Assessment: 0-10 Pain Score: 1  Pain Location: R hip  Pain Descriptors / Indicators: Aching;Operative site guarding Pain Intervention(s): Limited activity within patient's tolerance;Monitored during session;Repositioned    Home Living Family/patient expects to be discharged to:: Private residence Living Arrangements: Spouse/significant other Available Help at Discharge: Family;Available 24 hours/day Type of Home: House Home Access: Stairs to enter Entrance Stairs-Rails: None Entrance Stairs-Number of Steps: 1(porch step ) Home Layout: One level Home Equipment: Cane - single point      Prior Function Level of Independence: Independent with assistive device(s)         Comments: USed cane for ambulation      Hand Dominance   Dominant Hand: Right    Extremity/Trunk Assessment   Upper Extremity Assessment Upper Extremity Assessment: Defer to OT evaluation    Lower Extremity Assessment Lower Extremity Assessment: RLE deficits/detail RLE Deficits / Details: Decreased sensation reported in RLE. Able to perform ther ex below. Deficits consistent with post op pain and weakness.     Cervical / Trunk Assessment Cervical / Trunk Assessment: Normal  Communication  Communication: No difficulties  Cognition Arousal/Alertness: Awake/alert Behavior During Therapy: WFL for tasks assessed/performed Overall Cognitive Status: Within  Functional Limits for tasks assessed                                        General Comments General comments (skin integrity, edema, etc.): Pt's wife present during session.     Exercises Total Joint Exercises Ankle Circles/Pumps: AROM;Both;20 reps Quad Sets: AROM;Right;10 reps Heel Slides: AROM;Right;10 reps   Assessment/Plan    PT Assessment Patient needs continued PT services  PT Problem List Decreased strength;Decreased range of motion;Decreased balance;Decreased mobility;Decreased coordination;Decreased knowledge of use of DME;Decreased knowledge of precautions;Pain       PT Treatment Interventions DME instruction;Gait training;Stair training;Functional mobility training;Therapeutic activities;Therapeutic exercise;Balance training;Neuromuscular re-education;Patient/family education    PT Goals (Current goals can be found in the Care Plan section)  Acute Rehab PT Goals Patient Stated Goal: to go home  PT Goal Formulation: With patient Time For Goal Achievement: 07/28/17 Potential to Achieve Goals: Good    Frequency 7X/week   Barriers to discharge        Co-evaluation               AM-PAC PT "6 Clicks" Daily Activity  Outcome Measure Difficulty turning over in bed (including adjusting bedclothes, sheets and blankets)?: A Little Difficulty moving from lying on back to sitting on the side of the bed? : Unable Difficulty sitting down on and standing up from a chair with arms (e.g., wheelchair, bedside commode, etc,.)?: Unable Help needed moving to and from a bed to chair (including a wheelchair)?: A Little Help needed walking in hospital room?: A Little Help needed climbing 3-5 steps with a railing? : A Lot 6 Click Score: 13    End of Session Equipment Utilized During Treatment: Gait belt Activity Tolerance: Patient tolerated treatment well Patient left: in chair;with call bell/phone within reach;with family/visitor present Nurse  Communication: Mobility status PT Visit Diagnosis: Other abnormalities of gait and mobility (R26.89);Pain;Unsteadiness on feet (R26.81) Pain - Right/Left: Right Pain - part of body: Hip    Time: 7510-2585 PT Time Calculation (min) (ACUTE ONLY): 25 min   Charges:   PT Evaluation $PT Eval Low Complexity: 1 Low PT Treatments $Therapeutic Activity: 8-22 mins   PT G Codes:        Leighton Ruff, PT, DPT  Acute Rehabilitation Services  Pager: [phone]   Rudean Hitt 07/14/2017, 5:24 PM

## 2017-07-14 NOTE — Progress Notes (Signed)
Already took his tylenol 1000mg  this am

## 2017-07-14 NOTE — Interval H&P Note (Signed)
History and Physical Interval Note:  07/14/2017 7:18 AM  Joseph Massey  has presented today for surgery, with the diagnosis of OA RIGHT HIP  The various methods of treatment have been discussed with the patient and family. After consideration of risks, benefits and other options for treatment, the patient has consented to  Procedure(s): RIGHT TOTAL HIP ARTHROPLASTY ANTERIOR APPROACH (Right) as a surgical intervention .  The patient's history has been reviewed, patient examined, no change in status, stable for surgery.  I have reviewed the patient's chart and labs.  Questions were answered to the patient's satisfaction.     Joselyn Edling D

## 2017-07-14 NOTE — Anesthesia Procedure Notes (Signed)
Spinal  Patient location during procedure: OR Staffing Anesthesiologist: Lyndle Herrlich, MD Preanesthetic Checklist Completed: patient identified, site marked, surgical consent, pre-op evaluation, timeout performed, IV checked, risks and benefits discussed and monitors and equipment checked Spinal Block Patient position: sitting Prep: DuraPrep Patient monitoring: heart rate, cardiac monitor, continuous pulse ox and blood pressure Approach: midline Location: L3-4 Injection technique: single-shot Needle Needle type: Sprotte  Needle gauge: 24 G Needle length: 9 cm Assessment Sensory level: T4 Additional Notes Spinal Dosage in OR  .75% Bupivicaine ml       1.9

## 2017-07-14 NOTE — Anesthesia Preprocedure Evaluation (Signed)
Anesthesia Evaluation  Patient identified by MRN, date of birth, ID band Patient awake    Reviewed: Allergy & Precautions, H&P , Patient's Chart, lab work & pertinent test results  Airway Mallampati: II  TM Distance: >3 FB Neck ROM: full    Dental no notable dental hx.    Pulmonary sleep apnea ,    Pulmonary exam normal breath sounds clear to auscultation       Cardiovascular Exercise Tolerance: Good hypertension,  Rhythm:regular Rate:Normal     Neuro/Psych    GI/Hepatic   Endo/Other    Renal/GU      Musculoskeletal   Abdominal   Peds  Hematology   Anesthesia Other Findings   Reproductive/Obstetrics                             Anesthesia Physical Anesthesia Plan  ASA: II  Anesthesia Plan: Spinal   Post-op Pain Management:    Induction:   PONV Risk Score and Plan: 2 and Treatment may vary due to age or medical condition, Dexamethasone and Ondansetron  Airway Management Planned:   Additional Equipment:   Intra-op Plan:   Post-operative Plan:   Informed Consent: I have reviewed the patients History and Physical, chart, labs and discussed the procedure including the risks, benefits and alternatives for the proposed anesthesia with the patient or authorized representative who has indicated his/her understanding and acceptance.     Plan Discussed with:   Anesthesia Plan Comments: (  )        Anesthesia Quick Evaluation

## 2017-07-14 NOTE — Op Note (Signed)
07/14/2017  11:33 AM  PATIENT:  Joseph Massey   MRN: [id number]  PRE-OPERATIVE DIAGNOSIS:  OA RIGHT HIP  POST-OPERATIVE DIAGNOSIS:  Osteoarthritis RIGHT HIP  PROCEDURE:  Procedure(s): RIGHT TOTAL HIP ARTHROPLASTY ANTERIOR APPROACH  PREOPERATIVE INDICATIONS:    Joseph Massey is an 67 y.o. male who has a diagnosis of Primary osteoarthritis of right hip and elected for surgical management after failing conservative treatment.  The risks benefits and alternatives were discussed with the patient including but not limited to the risks of nonoperative treatment, versus surgical intervention including infection, bleeding, nerve injury, periprosthetic fracture, the need for revision surgery, dislocation, leg length discrepancy, blood clots, cardiopulmonary complications, morbidity, mortality, among others, and they were willing to proceed.     OPERATIVE REPORT     SURGEON:   Margretta Zamorano, Ernesta Amble, MD    ASSISTANT:  Roxan Hockey, PA-C, he was present and scrubbed throughout the case, critical for completion in a timely fashion, and for retraction, instrumentation, and closure.     ANESTHESIA:  General    COMPLICATIONS:  None.     COMPONENTS:  St[employer]ker acolade fit femur size 7 with a 36 mm -5 head ball and a PSL acetabular shell size 54 with a  polyethylene liner    PROCEDURE IN DETAIL:   The patient was met in the holding area and  identified.  The appropriate hip was identified and marked at the operative site.  The patient was then transported to the OR  and  placed under anesthesia per that record.  At that point, the patient was  placed in the supine position and  secured to the operating room table and all bony prominences padded. He received pre-operative antibiotics    The operative lower extremity was prepped from the iliac crest to the distal leg.  Sterile draping was performed.  Time out was performed prior to incision.      Skin incision was made just 2 cm lateral to the ASIS   extending in line with the tensor fascia lata. Electrocautery was used to control all bleeders. I dissected down sharply to the fascia of the tensor fascia lata was confirmed that the muscle fibers beneath were running posteriorly. I then incised the fascia over the superficial tensor fascia lata in line with the incision. The fascia was elevated off the anterior aspect of the muscle the muscle was retracted posteriorly and protected throughout the case. I then used electrocautery to incise the tensor fascia lata fascia control and all bleeders. Immediately visible was the fat over top of the anterior neck and capsule.  I removed the anterior fat from the capsule and elevated the rectus muscle off of the anterior capsule. I then removed a large time of capsule. The retractors were then placed over the anterior acetabulum as well as around the superior and inferior neck.  I then removed a section of the femoral neck and a napkin ring fashion. Then used the power course to remove the femoral head from the acetabulum and thoroughly irrigated the acetabulum. I sized the femoral head.    I then exposed the deep acetabulum, cleared out any tissue including the ligamentum teres.   After adequate visualization, I excised the labrum, and then sequentially reamed.  I then impacted the acetabular implant into place using fluoroscopy for guidance.  Appropriate version and inclination was confirmed clinically matching their bony anatomy, and with fluoroscopy.  I placed a 20 mm screw in the posterior/superio position with an excellent bite.  I then placed the polyethylene liner in place  I then adducted the leg and released the external rotators from the posterior femur allowing it to be easily delivered up lateral and anterior to the acetabulum for preparation of the femoral canal.    I then prepared the proximal femur using the cookie-cutter and then sequentially reamed and broached.  A trial broach, neck, and  head was utilized, and I reduced the hip and used floroscopy to assess the neck length and femoral implant.  I then impacted the femoral prosthesis into place into the appropriate version. The hip was then reduced and fluoroscopy confirmed appropriate position. Leg lengths were restored.  I then irrigated the hip copiously again with, and repaired the fascia with Vicryl, followed by monocryl for the subcutaneous tissue, Monocryl for the skin, Steri-Strips and sterile gauze. The patient was then awakened and returned to PACU in stable and satisfactory condition. There were no complications.  POST OPERATIVE PLAN: WBAT, DVT px: SCD's/TED, ambulation and chemical dvt px  Torrey Horseman, MD Orthopedic Surgeon [phone]     

## 2017-07-14 NOTE — Discharge Instructions (Signed)

## 2017-07-14 NOTE — Transfer of Care (Signed)
Immediate Anesthesia Transfer of Care Note  Patient: Joseph Massey  Procedure(s) Performed: RIGHT TOTAL HIP ARTHROPLASTY ANTERIOR APPROACH (Right Hip)  Patient Location: PACU  Anesthesia Type:Spinal  Level of Consciousness: drowsy and patient cooperative  Airway & Oxygen Therapy: Patient Spontanous Breathing and Patient connected to face mask oxygen  Post-op Assessment: Report given to RN and Post -op Vital signs reviewed and stable  Post vital signs: Reviewed and stable  Last Vitals:  Vitals:   07/14/17 0748 07/14/17 1216  BP: 131/80   Pulse: 71   Resp: 20   Temp: 36.9 C (P) 36.6 C  SpO2: 96%     Last Pain:  Vitals:   07/14/17 1216  TempSrc:   PainSc: (P) 0-No pain         Complications: No apparent anesthesia complications

## 2017-07-15 ENCOUNTER — Encounter (HOSPITAL_COMMUNITY): Payer: Self-pay | Admitting: Orthopedic Surgery

## 2017-07-15 NOTE — Plan of Care (Signed)
Patient turns when needed

## 2017-07-15 NOTE — Progress Notes (Signed)
Patient was discharged via wheel chair with his wife.  Written and verbal instructions given.  Patient verbalizes understanding instructions.  Patient has information for [facility].

## 2017-07-15 NOTE — Progress Notes (Signed)
Physical Therapy Treatment Patient Details Name: Joseph Massey MRN: [id number] DOB: [birth year] Today's Date: 07/15/2017    History of Present Illness Pt is a 67 y/o male s/p elective R THA, direct anterior approach. PMH includes HTN and obesity.     PT Comments    Patient is making good progress with PT.  From a mobility standpoint anticipate patient will be ready for DC home when medically ready.    Follow Up Recommendations  Follow surgeon's recommendation for DC plan and follow-up therapies;Supervision for mobility/OOB     Equipment Recommendations  Rolling walker with 5" wheels;3in1 (PT)    Recommendations for Other Services       Precautions / Restrictions Precautions Precautions: Fall Restrictions Weight Bearing Restrictions: Yes RLE Weight Bearing: Weight bearing as tolerated    Mobility  Bed Mobility Overal bed mobility: Modified Independent Bed Mobility: Supine to Sit              Transfers Overall transfer level: Modified independent Equipment used: Rolling walker (2 wheeled) Transfers: Sit to/from Stand           General transfer comment: use of RW upon standing  Ambulation/Gait Ambulation/Gait assistance: Supervision Ambulation Distance (Feet): 250 Feet Assistive device: Rolling walker (2 wheeled) Gait Pattern/deviations: Decreased weight shift to right;Step-through pattern;Decreased stride length     General Gait Details: cues for proximity to RW especially when turning   Stairs Stairs: Yes   Stair Management: No rails;Backwards;With walker;Step to pattern Number of Stairs: 4 General stair comments: cues for sequencing and technique with carry over demonstrated from previous session; wife assisted with stabilizing RW   Wheelchair Mobility    Modified Rankin (Stroke Patients Only)       Balance Overall balance assessment: Needs assistance Sitting-balance support: No upper extremity supported;Feet supported Sitting  balance-Leahy Scale: Good     Standing balance support: During functional activity;Single extremity supported Standing balance-Leahy Scale: Fair Standing balance comment: pt able to static stand without UE support                            Cognition Arousal/Alertness: Awake/alert Behavior During Therapy: WFL for tasks assessed/performed Overall Cognitive Status: Within Functional Limits for tasks assessed                                        Exercises Total Joint Exercises Hip ABduction/ADduction: AROM;Right;10 reps;Standing;Supine Knee Flexion: AROM;Right;10 reps;Standing Marching in Standing: AROM;Right;10 reps;Standing Standing Hip Extension: AROM;Right;10 reps;Standing    General Comments        Pertinent Vitals/Pain Pain Assessment: Faces Faces Pain Scale: Hurts little more Pain Location: R hip  Pain Descriptors / Indicators: Sore Pain Intervention(s): Limited activity within patient's tolerance;Monitored during session;Repositioned    Home Living                      Prior Function            PT Goals (current goals can now be found in the care plan section) Acute Rehab PT Goals Patient Stated Goal: to go home  PT Goal Formulation: With patient Time For Goal Achievement: 07/28/17 Potential to Achieve Goals: Good Progress towards PT goals: Progressing toward goals    Frequency    7X/week      PT Plan Current plan remains appropriate    Co-evaluation  AM-PAC PT "6 Clicks" Daily Activity  Outcome Measure  Difficulty turning over in bed (including adjusting bedclothes, sheets and blankets)?: A Little Difficulty moving from lying on back to sitting on the side of the bed? : A Little Difficulty sitting down on and standing up from a chair with arms (e.g., wheelchair, bedside commode, etc,.)?: A Little Help needed moving to and from a bed to chair (including a wheelchair)?: None Help needed  walking in hospital room?: A Little Help needed climbing 3-5 steps with a railing? : A Little 6 Click Score: 19    End of Session Equipment Utilized During Treatment: Gait belt Activity Tolerance: Patient tolerated treatment well Patient left: with call bell/phone within reach;with family/visitor present;in bed(sitting EOB) Nurse Communication: Mobility status PT Visit Diagnosis: Other abnormalities of gait and mobility (R26.89);Pain;Unsteadiness on feet (R26.81) Pain - Right/Left: Right Pain - part of body: Hip     Time: 3419-6222 PT Time Calculation (min) (ACUTE ONLY): 27 min  Charges:  $Gait Training: 8-22 mins $Therapeutic Exercise: 8-22 mins                    G Codes:       Earney Navy, PTA Pager: [phone]     Darliss Cheney 07/15/2017, 1:37 PM

## 2017-07-15 NOTE — Anesthesia Postprocedure Evaluation (Signed)
Anesthesia Post Note  Patient: Joseph Massey  Procedure(s) Performed: RIGHT TOTAL HIP ARTHROPLASTY ANTERIOR APPROACH (Right Hip)     Patient location during evaluation: PACU Anesthesia Type: Spinal Level of consciousness: awake Pain management: satisfactory to patient Vital Signs Assessment: post-procedure vital signs reviewed and stable Respiratory status: spontaneous breathing Cardiovascular status: blood pressure returned to baseline Postop Assessment: no headache and spinal receding Anesthetic complications: no    Last Vitals:  Vitals:   07/14/17 1929 07/15/17 0010  BP: 132/81 105/60  Pulse: 85 74  Resp: 18 16  Temp: 36.7 C 36.4 C  SpO2: 94% 94%    Last Pain:  Vitals:   07/15/17 0010  TempSrc: Oral  PainSc:    Pain Goal: Patients Stated Pain Goal: 2 (07/14/17 1655)               Lyndle Herrlich EDWARD

## 2017-07-15 NOTE — Care Management Note (Signed)
Case Management Note  Patient Details  Name: Joseph Massey MRN: [id number] Date of Birth: [birth year]  Subjective/Objective: 67 yr old gentleman s/p right total hip arthroplasty.                   Action/Plan: patient was preoperatively setup with [facility], no changes. DME has been delivered to his room. Patient will have family support at discharge.   Expected Discharge Date:  07/15/17               Expected Discharge Plan:  [name]  In-House Referral:  NA  Discharge planning Services  CM Consult  Post Acute Care Choice:  Durable Medical Equipment, Home Health Choice offered to:  Patient  DME Arranged:  3-N-1, Walker rolling DME Agency:  [employer]  HH Arranged:  PT [place]:  [facility] [employer]  Status of Service:  Completed, signed off  If discussed at [employer], dates discussed:    Additional Comments:  Ninfa Meeker, RN 07/15/2017, 2:23 PM

## 2017-07-15 NOTE — Discharge Summary (Signed)
Discharge Summary  Patient ID: Joseph Massey MRN: [id number] DOB/AGE: Jan 27, 1951 67 y.o.  Admit date: 07/14/2017 Discharge date: 07/15/2017  Admission Diagnoses:  Primary osteoarthritis of right hip  Discharge Diagnoses:  Principal Problem:   Primary osteoarthritis of right hip Active Problems:   Primary osteoarthritis, right shoulder   Primary osteoarthritis of right knee   Gout   Essential hypertension   Past Medical History:  Diagnosis Date  . Arthritis   . BPH (benign prostatic hyperplasia)   . ED (erectile dysfunction)   . Gout   . History of kidney stones   . Hyperlipidemia   . Hypertension   . Obesity   . Sleep apnea   . UC (ulcerative colitis) ([place])     Surgeries: Procedure(s): RIGHT TOTAL HIP ARTHROPLASTY ANTERIOR APPROACH on 07/14/2017   Consultants (if any):   Discharged Condition: Improved  Hospital Course: Joseph Massey is an 67 y.o. male who was admitted 07/14/2017 with a diagnosis of Primary osteoarthritis of right hip and went to the operating room on 07/14/2017 and underwent the above named procedures.    He was given perioperative antibiotics:  Anti-infectives (From admission, onward)   Start     Dose/Rate Route Frequency Ordered Stop   07/14/17 1600  ceFAZolin (ANCEF) IVPB 1 g/50 mL premix     1 g 100 mL/hr over 30 Minutes Intravenous Every 6 hours 07/14/17 1519 07/14/17 2209   07/14/17 0722  ceFAZolin (ANCEF) IVPB 2g/100 mL premix     2 g 200 mL/hr over 30 Minutes Intravenous On call to O.R. 07/14/17 5277 07/14/17 1016    .  He was given sequential compression devices, early ambulation, and ASA for DVT prophylaxis.  He benefited maximally from the hospital stay and there were no complications.    Recent vital signs:  Vitals:   07/14/17 1929 07/15/17 0010  BP: 132/81 105/60  Pulse: 85 74  Resp: 18 16  Temp: 98 F (36.7 C) 97.6 F (36.4 C)  SpO2: 94% 94%    Recent laboratory studies:  Lab Results  Component Value Date   HGB  14.0 07/03/2017   Lab Results  Component Value Date   WBC 5.6 07/03/2017   PLT 228 07/03/2017   No results found for: INR Lab Results  Component Value Date   NA 137 07/03/2017   K 4.0 07/03/2017   CL 103 07/03/2017   CO2 25 07/03/2017   BUN 13 07/03/2017   CREATININE 0.72 07/03/2017   GLUCOSE 102 (H) 07/03/2017    Discharge Medications:   Allergies as of 07/15/2017      Reactions   Statins Other (See Comments)   Muscle cramps   Vytorin [ezetimibe-simvastatin] Other (See Comments)   Muscle cramps      Medication List    STOP taking these medications   acetaminophen 500 MG tablet Commonly known as:  TYLENOL   ibuprofen 200 MG tablet Commonly known as:  ADVIL,MOTRIN     TAKE these medications   allopurinol 300 MG tablet Commonly known as:  ZYLOPRIM Take 300 mg by mouth daily.   aspirin EC 325 MG tablet Take 1 tablet (325 mg total) by mouth daily. For 30 days post op for DVT Prophylaxis   baclofen 10 MG tablet Commonly known as:  LIORESAL Take 1 tablet (10 mg total) by mouth 3 (three) times daily as needed for muscle spasms.   celecoxib 200 MG capsule Commonly known as:  CELEBREX Take 1 capsule (200 mg total) by mouth 2 (  two) times daily. For 2 weeks post op for pain and inflammation.  Discontinue Ibuprofen or other Anti-inflammatory medicine when taking this medicine.   diclofenac sodium 1 % Gel Commonly known as:  VOLTAREN Apply 1 application topically 3 (three) times daily as needed (for pain).   docusate sodium 100 MG capsule Commonly known as:  COLACE Take 1 capsule (100 mg total) by mouth 2 (two) times daily. To prevent constipation while taking pain medication.   doxazosin 1 MG tablet Commonly known as:  CARDURA Take 1 mg by mouth daily.   HYDROcodone-acetaminophen 5-325 MG tablet Commonly known as:  NORCO Take 1-2 tablets by mouth every 4 (four) hours as needed for moderate pain.   lisinopril-hydrochlorothiazide 20-12.5 MG tablet Commonly  known as:  PRINZIDE,ZESTORETIC Take 1 tablet by mouth daily.   omeprazole 20 MG capsule Commonly known as:  PRILOSEC Take 1 capsule (20 mg total) by mouth daily. 30 days for gastroprotection while taking NSAIDs.   ondansetron 4 MG tablet Commonly known as:  ZOFRAN Take 1 tablet (4 mg total) by mouth every 8 (eight) hours as needed for nausea or vomiting.       Diagnostic Studies: Dg C-arm 1-60 Min  Result Date: 07/14/2017 CLINICAL DATA:  Right total hip arthroplasty. EXAM: OPERATIVE RIGHT HIP (WITH PELVIS IF PERFORMED) TECHNIQUE: Fluoroscopic spot image(s) were submitted for interpretation post-operatively. COMPARISON:  None. FINDINGS: Postsurgical changes related to right total hip arthroplasty. Components are well aligned. No evidence of hardware failure or loosening. IMPRESSION: Interval right total hip arthroplasty. FLUOROSCOPY TIME:  23 seconds. Electronically Signed   By: Titus Dubin M.D.   On: 07/14/2017 11:59   Dg Hip Operative Unilat W Or W/o Pelvis Right  Result Date: 07/14/2017 CLINICAL DATA:  Right total hip arthroplasty. EXAM: OPERATIVE RIGHT HIP (WITH PELVIS IF PERFORMED) TECHNIQUE: Fluoroscopic spot image(s) were submitted for interpretation post-operatively. COMPARISON:  None. FINDINGS: Postsurgical changes related to right total hip arthroplasty. Components are well aligned. No evidence of hardware failure or loosening. IMPRESSION: Interval right total hip arthroplasty. FLUOROSCOPY TIME:  23 seconds. Electronically Signed   By: Titus Dubin M.D.   On: 07/14/2017 11:59    Disposition: 01-Home or Self Care    Follow-up Information    Renette Butters, MD Follow up.   Specialty:  Orthopedic Surgery Contact information: [address] [place] [address] [phone]            Signed: Prudencio Burly III PA-C 07/15/2017, 7:49 AM

## 2017-07-15 NOTE — Progress Notes (Signed)
    Subjective: Patient reports pain as mild.  Tolerating diet.  Urinating.  +Flatus.  No CP, SOB.  OOB mobilizing well.  Objective:   VITALS:   Vitals:   07/14/17 1439 07/14/17 1500 07/14/17 1929 07/15/17 0010  BP:  118/78 132/81 105/60  Pulse:  68 85 74  Resp:  18 18 16   Temp: 97.8 F (36.6 C) 98 F (36.7 C) 98 F (36.7 C) 97.6 F (36.4 C)  TempSrc:  Oral Oral Oral  SpO2:  98% 94% 94%  Weight:      Height:       CBC Latest Ref Rng & Units 07/03/2017  WBC 4.0 - 10.5 K/uL 5.6  Hemoglobin 13.0 - 17.0 g/dL 14.0  Hematocrit 39.0 - 52.0 % 41.1  Platelets 150 - 400 K/uL 228   BMP Latest Ref Rng & Units 07/03/2017  Glucose 65 - 99 mg/dL 102(H)  BUN 6 - 20 mg/dL 13  Creatinine 0.61 - 1.24 mg/dL 0.72  Sodium 135 - 145 mmol/L 137  Potassium 3.5 - 5.1 mmol/L 4.0  Chloride 101 - 111 mmol/L 103  CO2 22 - 32 mmol/L 25  Calcium 8.9 - 10.3 mg/dL 9.1   Intake/Output      02/12 0701 - 02/13 0700 02/13 0701 - 02/14 0700   P.O. 960    I.V. (mL/kg) 1339.6 (12.5)    IV Piggyback 50    Total Intake(mL/kg) 2349.6 (22)    Urine (mL/kg/hr) 1800 (0.7)    Blood 200    Total Output 2000    Net +349.6            Physical Exam: General: NAD.  Upright in bed.  Calm, conversant. Resp: No increased wob Cardio: regular rate and rhythm ABD soft Neurologically intact MSK Neurovascularly intact Sensation intact distally Intact pulses distally Dorsiflexion/Plantar flexion intact Incision: dressing C/D/I   Assessment: 1 Day Post-Op  S/P Procedure(s) (LRB): RIGHT TOTAL HIP ARTHROPLASTY ANTERIOR APPROACH (Right) by Dr. Ernesta Amble. Murphy on 07/14/17  Principal Problem:   Primary osteoarthritis of right hip Active Problems:   Primary osteoarthritis, right shoulder   Primary osteoarthritis of right knee   Gout   Essential hypertension  S/P Right Total Hip Arthroplasty Doing well POD1. Eating, drinking, voiding, mobilizing.  Pain controlled.  Plan: Up with therapy D/C IV  fluids Incentive Spirometry Elevate and Apply ice  Weight Bearing: Weight Bearing as Tolerated (WBAT)  Dressings: Maintain Mepilex.  VTE prophylaxis: Aspirin, SCDs, ambulation Dispo: Home today after therapy   Prudencio Burly III, PA-C 07/15/2017, 7:46 AM

## 2017-07-15 NOTE — Progress Notes (Signed)
Visited pts room to set pt up on CPAP pt was asleep woke him up and advised him I was goin gto set him up he said someone had mentioned it but he didn't want to do it now.  Advised pt if he changes his mind to let me know.

## 2017-07-15 NOTE — Evaluation (Signed)
Occupational Therapy Evaluation Patient Details Name: Joseph Massey MRN: [id number] DOB: May 11, 1951 Today's Date: 07/15/2017    History of Present Illness Pt is a 67 y/o male s/p elective R THA, direct anterior approach. PMH includes HTN and obesity.    Clinical Impression   This 67 y/o M presents with the above. At baseline Pt is mod independent with ADLs and functional mobility using SPC. Pt completing functional mobility within room and functional transfers at RW level with MinGuard assist this session, currently requires minA for LB ADLs secondary to post-op pain and stiffness in RLE. Pt will be returning home with spouse who is able to assist with ADLs PRN. Education provided and questions answered throughout session. Feel Pt is safe to return home with available spouse assist once medically ready. No further acute OT needs identified at this time. Will sign off.     Follow Up Recommendations  Follow surgeon's recommendation for DC plan and follow-up therapies;Supervision/Assistance - 24 hour    Equipment Recommendations  3 in 1 bedside commode           Precautions / Restrictions Precautions Precautions: Fall Restrictions Weight Bearing Restrictions: Yes RLE Weight Bearing: Weight bearing as tolerated      Mobility Bed Mobility Overal bed mobility: Modified Independent Bed Mobility: Supine to Sit           General bed mobility comments: sitting EOB upon arrival   Transfers Overall transfer level: Modified independent Equipment used: Rolling walker (2 wheeled) Transfers: Sit to/from Stand           General transfer comment: use of RW upon standing    Balance Overall balance assessment: Needs assistance Sitting-balance support: No upper extremity supported;Feet supported Sitting balance-Leahy Scale: Good     Standing balance support: During functional activity;Single extremity supported Standing balance-Leahy Scale: Fair Standing balance comment: pt  able to static stand without UE support                           ADL either performed or assessed with clinical judgement   ADL Overall ADL's : Needs assistance/impaired Eating/Feeding: Modified independent;Sitting   Grooming: Min guard;Standing   Upper Body Bathing: Min guard;Sitting   Lower Body Bathing: Min guard;Sit to/from stand   Upper Body Dressing : Set up;Sitting   Lower Body Dressing: Minimal assistance;Sit to/from stand   Toilet Transfer: Min guard;Ambulation;RW;Regular Museum/gallery exhibitions officer and Hygiene: Min guard;Sit to/from stand   Tub/ Shower Transfer: Walk-in shower;Min guard;Ambulation;Shower seat;3 in Mudlogger Details (indicate cue type and reason): simulated in room; pt with good recall of technique  Functional mobility during ADLs: Min guard;Rolling walker General ADL Comments: educated pt/spouse on safety and compensatory techniques for completing ADLs and functional transfers; Pt having completing UB/LB dressing prior to start of session with spouse assist                          Pertinent Vitals/Pain Pain Assessment: Faces Faces Pain Scale: Hurts little more Pain Location: R hip  Pain Descriptors / Indicators: Sore Pain Intervention(s): Limited activity within patient's tolerance;Monitored during session     Hand Dominance Right   Extremity/Trunk Assessment Upper Extremity Assessment Upper Extremity Assessment: Overall WFL for tasks assessed   Lower Extremity Assessment Lower Extremity Assessment: Defer to PT evaluation   Cervical / Trunk Assessment Cervical / Trunk Assessment: Normal   Communication Communication Communication: No  difficulties   Cognition Arousal/Alertness: Awake/alert Behavior During Therapy: WFL for tasks assessed/performed Overall Cognitive Status: Within Functional Limits for tasks assessed                                                      Home Living Family/patient expects to be discharged to:: Private residence Living Arrangements: Spouse/significant other Available Help at Discharge: Family;Available 24 hours/day Type of Home: House Home Access: Stairs to enter [employer] of Steps: 1 Entrance Stairs-Rails: None Home Layout: One level     Bathroom Shower/Tub: Occupational psychologist: Handicapped height     Home Equipment: Cane - single point          Prior Functioning/Environment Level of Independence: Independent with assistive device(s)        Comments: USed cane for ambulation         OT Problem List: Decreased range of motion;Impaired balance (sitting and/or standing);Decreased knowledge of use of DME or AE;Decreased activity tolerance            OT Goals(Current goals can be found in the care plan section) Acute Rehab OT Goals Patient Stated Goal: to go home  OT Goal Formulation: All assessment and education complete, DC therapy                                 AM-PAC PT "6 Clicks" Daily Activity     Outcome Measure Help from another person eating meals?: None Help from another person taking care of personal grooming?: None Help from another person toileting, which includes using toliet, bedpan, or urinal?: A Little Help from another person bathing (including washing, rinsing, drying)?: A Little Help from another person to put on and taking off regular upper body clothing?: None Help from another person to put on and taking off regular lower body clothing?: A Little 6 Click Score: 21   End of Session Equipment Utilized During Treatment: Rolling walker Nurse Communication: Mobility status  Activity Tolerance: Patient tolerated treatment well Patient left: Other (comment);with family/visitor present;with call bell/phone within reach(sitting EOB )  OT Visit Diagnosis: Other abnormalities of gait and mobility (R26.89)                Time:  1340-1355 OT Time Calculation (min): 15 min Charges:  OT General Charges $OT Visit: 1 Visit OT Evaluation $OT Eval Low Complexity: 1 Low G-Codes:     Joseph Massey, OT Pager [phone] 07/15/2017   Raymondo Band 07/15/2017, 3:59 PM

## 2017-07-15 NOTE — Progress Notes (Signed)
Physical Therapy Treatment Patient Details Name: Joseph Massey MRN: [id number] DOB: June 08, 1950 Today's Date: 07/15/2017    History of Present Illness Pt is a 67 y/o male s/p elective R THA, direct anterior approach. PMH includes HTN and obesity.     PT Comments    Patient is progressing well toward mobility goals. Pt tolerated gait and stair training well. Pt overall mod I/supervision for functional transfers, bed mobility, and ambulation. Continue to progress as tolerated.    Follow Up Recommendations  Follow surgeon's recommendation for DC plan and follow-up therapies;Supervision for mobility/OOB     Equipment Recommendations  Rolling walker with 5" wheels;3in1 (PT)    Recommendations for Other Services       Precautions / Restrictions Precautions Precautions: Fall Restrictions Weight Bearing Restrictions: Yes RLE Weight Bearing: Weight bearing as tolerated    Mobility  Bed Mobility Overal bed mobility: Modified Independent Bed Mobility: Supine to Sit              Transfers Overall transfer level: Modified independent Equipment used: Rolling walker (2 wheeled) Transfers: Sit to/from Stand           General transfer comment: use of RW upon standing  Ambulation/Gait Ambulation/Gait assistance: Supervision Ambulation Distance (Feet): 250 Feet Assistive device: Rolling walker (2 wheeled) Gait Pattern/deviations: Decreased weight shift to right;Step-through pattern;Decreased stride length Gait velocity: Decreased    General Gait Details: cues for sequencing and proximity to RW; steady gait   Stairs Stairs: Yes   Stair Management: No rails;Backwards;With walker;One rail Left;Step to pattern Number of Stairs: (2 steps backwards with RW and 6 steps forward with rail) General stair comments: cues for sequencing and technique; assist to stabilize RW   Wheelchair Mobility    Modified Rankin (Stroke Patients Only)       Balance Overall balance  assessment: Needs assistance Sitting-balance support: No upper extremity supported;Feet supported Sitting balance-Leahy Scale: Good     Standing balance support: Bilateral upper extremity supported;During functional activity Standing balance-Leahy Scale: Fair Standing balance comment: pt able to static stand without UE support                            Cognition Arousal/Alertness: Awake/alert Behavior During Therapy: WFL for tasks assessed/performed Overall Cognitive Status: Within Functional Limits for tasks assessed                                        Exercises Total Joint Exercises Short Arc Quad: AROM;Right;10 reps Hip ABduction/ADduction: AROM;Right;10 reps Long Arc Quad: AROM;Right;15 reps;Seated    General Comments        Pertinent Vitals/Pain Pain Assessment: Faces Faces Pain Scale: Hurts a little bit Pain Location: R hip  Pain Descriptors / Indicators: Discomfort;Sore Pain Intervention(s): Monitored during session;Repositioned;Premedicated before session    Home Living                      Prior Function            PT Goals (current goals can now be found in the care plan section) Acute Rehab PT Goals Patient Stated Goal: to go home  PT Goal Formulation: With patient Time For Goal Achievement: 07/28/17 Potential to Achieve Goals: Good Progress towards PT goals: Progressing toward goals    Frequency    7X/week      PT Plan  Current plan remains appropriate    Co-evaluation              AM-PAC PT "6 Clicks" Daily Activity  Outcome Measure  Difficulty turning over in bed (including adjusting bedclothes, sheets and blankets)?: A Little Difficulty moving from lying on back to sitting on the side of the bed? : A Little Difficulty sitting down on and standing up from a chair with arms (e.g., wheelchair, bedside commode, etc,.)?: A Little Help needed moving to and from a bed to chair (including a  wheelchair)?: None Help needed walking in hospital room?: A Little Help needed climbing 3-5 steps with a railing? : A Little 6 Click Score: 19    End of Session Equipment Utilized During Treatment: Gait belt Activity Tolerance: Patient tolerated treatment well Patient left: in chair;with call bell/phone within reach;with family/visitor present Nurse Communication: Mobility status PT Visit Diagnosis: Other abnormalities of gait and mobility (R26.89);Pain;Unsteadiness on feet (R26.81) Pain - Right/Left: Right Pain - part of body: Hip     Time: 0832-0905 PT Time Calculation (min) (ACUTE ONLY): 33 min  Charges:  $Gait Training: 8-22 mins $Therapeutic Exercise: 8-22 mins                    G Codes:       Joseph Massey, PTA Pager: [phone]     Joseph Massey 07/15/2017, 9:12 AM

## 2018-01-29 ENCOUNTER — Ambulatory Visit: Payer: Self-pay | Admitting: Allergy

## 2018-02-22 ENCOUNTER — Ambulatory Visit (INDEPENDENT_AMBULATORY_CARE_PROVIDER_SITE_OTHER): Payer: Medicare Other | Admitting: Allergy and Immunology

## 2018-02-22 ENCOUNTER — Encounter: Payer: Self-pay | Admitting: Allergy and Immunology

## 2018-02-22 VITALS — BP 128/70 | HR 73 | Temp 98.4°F | Ht 70.0 in | Wt 243.4 lb

## 2018-02-22 DIAGNOSIS — T7840XA Allergy, unspecified, initial encounter: Secondary | ICD-10-CM | POA: Insufficient documentation

## 2018-02-22 DIAGNOSIS — T7840XD Allergy, unspecified, subsequent encounter: Secondary | ICD-10-CM

## 2018-02-22 DIAGNOSIS — J3089 Other allergic rhinitis: Secondary | ICD-10-CM | POA: Diagnosis not present

## 2018-02-22 DIAGNOSIS — K2970 Gastritis, unspecified, without bleeding: Secondary | ICD-10-CM | POA: Insufficient documentation

## 2018-02-22 DIAGNOSIS — L500 Allergic urticaria: Secondary | ICD-10-CM | POA: Diagnosis not present

## 2018-02-22 HISTORY — DX: Allergic urticaria: L50.0

## 2018-02-22 MED ORDER — LEVOCETIRIZINE DIHYDROCHLORIDE 5 MG PO TABS: 5.0000 mg | ORAL_TABLET | Freq: Every day | ORAL | 5 refills | Status: DC

## 2018-02-22 MED ORDER — FAMOTIDINE 20 MG PO TABS: 20.0000 mg | ORAL_TABLET | Freq: Two times a day (BID) | ORAL | 5 refills | Status: DC

## 2018-02-22 MED ORDER — FLUTICASONE PROPIONATE 50 MCG/ACT NA SUSP: NASAL | 5 refills | Status: DC

## 2018-02-22 NOTE — Assessment & Plan Note (Signed)
Unclear etiology. Skin tests to select food allergens were negative today. NSAIDs and emotional stress commonly exacerbate urticaria but are not the underlying etiology in this case. Physical urticarias are negative by history (i.e. pressure-induced, temperature, vibration, solar, etc.). History and lesions are not consistent with urticaria pigmentosa so I am not suspicious for mastocytosis. There are no concomitant symptoms concerning for anaphylaxis or constitutional symptoms worrisome for an underlying malignancy. We will rule out other potential etiologies with labs. For symptom relief, patient is to take oral antihistamines as directed.  The following labs have been ordered: Anti-thyroglobulin antibody, thyroid peroxidase antibody, tryptase, urea breath test, CBC, CMP, ESR, ANA, and galactose-alpha-1,3-galactose IgE level.  The patient will be called with further recommendations after lab results have returned.  Instructions have been discussed and provided for H1/H2 receptor blockade with titration to find lowest effective dose.  A prescription has been provided for levocetirizine, 5 mg daily as needed.  A prescription has been provided for famotidine, 20 mg twice daily as needed.  Should there be a significant increase or change in symptoms, a journal is to be kept recording any foods eaten, beverages consumed, medications taken within a 6 hour period prior to the onset of symptoms, as well as record activities being performed, and environmental conditions. For any symptoms concerning for anaphylaxis, 911 is to be called immediately.

## 2018-02-22 NOTE — Progress Notes (Signed)
New Patient Note  RE: Joseph Massey MRN: [id number] DOB: Oct 27, 1950 Date of Office Visit: 02/22/2018  Referring provider: Vernie Shanks, MD Primary care provider: Vernie Shanks, MD  Chief Complaint: Rash (3 months)   History of present illness: Joseph Massey is a 67 y.o. male seen today in consultation requested by Yaakov Guthrie, MD.  Over the past 3 months, Zyir has experienced recurrent episodes of hives. The hives have appeared at different times over his entire body.  The lesions are described as erythematous, raised, and pruritic.  Individual hives last less than 24 hours without leaving residual pigmentation or bruising. He denies concomitant angioedema, cardiopulmonary symptoms and GI symptoms. He has not experienced unexpected weight loss, recurrent fevers or drenching night sweats. No specific medication, food, skin care product, detergent, soap, or other environmental triggers have been identified. The symptoms do not seem to correlate with NSAIDs use or emotional stress. He did not have symptoms consistent with a respiratory tract infection at the time of symptom onset. [name] has tried to control symptoms with hydrocortisone topical and benedryl which have offered adequate temporary relief. He has not been evaluated and treated in the emergency department for these symptoms. Skin biopsy has not been performed. Recently, he has been experiencing hives almost daily.  [name] experiences some nasal congestion and and rhinorrhea on occasion.  The rhinorrhea may occur more frequently with pollen exposure, however he is not certain of this.  Assessment and plan: Recurrent urticaria Unclear etiology. Skin tests to select food allergens were negative today. NSAIDs and emotional stress commonly exacerbate urticaria but are not the underlying etiology in this case. Physical urticarias are negative by history (i.e. pressure-induced, temperature, vibration, solar, etc.). History and lesions  are not consistent with urticaria pigmentosa so I am not suspicious for mastocytosis. There are no concomitant symptoms concerning for anaphylaxis or constitutional symptoms worrisome for an underlying malignancy. We will rule out other potential etiologies with labs. For symptom relief, patient is to take oral antihistamines as directed.  The following labs have been ordered: Anti-thyroglobulin antibody, thyroid peroxidase antibody, tryptase, urea breath test, CBC, CMP, ESR, ANA, and galactose-alpha-1,3-galactose IgE level.  The patient will be called with further recommendations after lab results have returned.  Instructions have been discussed and provided for H1/H2 receptor blockade with titration to find lowest effective dose.  A prescription has been provided for levocetirizine, 5 mg daily as needed.  A prescription has been provided for famotidine, 20 mg twice daily as needed.  Should there be a significant increase or change in symptoms, a journal is to be kept recording any foods eaten, beverages consumed, medications taken within a 6 hour period prior to the onset of symptoms, as well as record activities being performed, and environmental conditions. For any symptoms concerning for anaphylaxis, 911 is to be called immediately.  Seasonal and perennial allergic rhinitis  Aeroallergen avoidance measures have been discussed and provided in written form.  Levocetirizine has been prescribed (as above).  A prescription has been provided for fluticasone nasal spray, one spray per nostril 1-2 times daily as needed. Proper nasal spray technique has been discussed and demonstrated.  Nasal saline spray (i.e., Simply Saline) or nasal saline lavage (i.e., NeilMed) is recommended as needed and prior to medicated nasal sprays.   Meds ordered this encounter  Medications  . levocetirizine (XYZAL) 5 MG tablet    Sig: Take 1 tablet (5 mg total) by mouth daily.    Dispense:  30 tablet  Refill:   5  . famotidine (PEPCID) 20 MG tablet    Sig: Take 1 tablet (20 mg total) by mouth 2 (two) times daily.    Dispense:  60 tablet    Refill:  5  . fluticasone (FLONASE) 50 MCG/ACT nasal spray    Sig: 1 spray 1-2 times daily in each nostril as needed.    Dispense:  16 g    Refill:  5    Diagnostics: Environmental skin testing: Robust reactivity to grass pollen, ragweed pollen, tree pollen, and dust mite antigen. Food allergen skin testing: Negative despite a positive histamine control.    Physical examination: Blood pressure 128/70, pulse 73, temperature 98.4 F (36.9 C), temperature source Oral, height '5\' 10"'  (1.778 m), weight 243 lb 6.4 oz (110.4 kg), SpO2 95 %.  General: Alert, interactive, in no acute distress. HEENT: TMs pearly gray, turbinates moderately edematous with thick discharge, post-pharynx moderately erythematous. Neck: Supple without lymphadenopathy. Lungs: Clear to auscultation without wheezing, rhonchi or rales. CV: Normal S1, S2 without murmurs. Abdomen: Nondistended, nontender. Skin: Warm and dry, without lesions or rashes. Extremities:  No clubbing, cyanosis or edema. Neuro:   Grossly intact.  Review of systems:  Review of systems negative except as noted in HPI / PMHx or noted below: Review of Systems  Constitutional: Negative.   HENT: Negative.   Eyes: Negative.   Respiratory: Negative.   Cardiovascular: Negative.   Gastrointestinal: Negative.   Genitourinary: Negative.   Musculoskeletal: Negative.   Skin: Negative.   Neurological: Negative.   Endo/Heme/Allergies: Negative.   Psychiatric/Behavioral: Negative.     Past medical history:  Past Medical History:  Diagnosis Date  . Arthritis   . BPH (benign prostatic hyperplasia)   . ED (erectile dysfunction)   . Gout   . History of kidney stones   . Hyperlipidemia   . Hypertension   . Obesity   . Recurrent upper respiratory infection (URI)   . Recurrent urticaria 02/22/2018  . Sleep apnea   .  UC (ulcerative colitis) ([place])     Past surgical history:  Past Surgical History:  Procedure Laterality Date  . COLONOSCOPY N/A 02/15/2013   Procedure: COLONOSCOPY;  Surgeon: Garlan Fair, MD;  Location: [facility];  Service: Endoscopy;  Laterality: N/A;  . dental implants    . SINOSCOPY    . TONSILLECTOMY    . TOTAL HIP ARTHROPLASTY Right 07/14/2017   Procedure: RIGHT TOTAL HIP ARTHROPLASTY ANTERIOR APPROACH;  Surgeon: Renette Butters, MD;  Location: [facility];  Service: Orthopedics;  Laterality: Right;    Family history: Family History  Problem Relation Age of Onset  . Allergic rhinitis Neg Hx   . Asthma Neg Hx   . Eczema Neg Hx   . Urticaria Neg Hx     Social history: Social History   Socioeconomic History  . Marital status: Married    Spouse name: Not on file  . Number of children: Not on file  . Years of education: Not on file  . Highest education level: Not on file  Occupational History  . Not on file  Social Needs  . Financial resource strain: Not on file  . Food insecurity:    Worry: Not on file    Inability: Not on file  . Transportation needs:    Medical: Not on file    Non-medical: Not on file  Tobacco Use  . Smoking status: Never Smoker  . Smokeless tobacco: Never Used  Substance and Sexual Activity  .  Alcohol use: Yes    Alcohol/week: 2.0 standard drinks    Types: 2 Glasses of wine per week    Comment: per day  . Drug use: No  . Sexual activity: Not on file  Lifestyle  . Physical activity:    Days per week: Not on file    Minutes per session: Not on file  . Stress: Not on file  Relationships  . Social connections:    Talks on phone: Not on file    Gets together: Not on file    Attends religious service: Not on file    Active member of club or organization: Not on file    Attends meetings of clubs or organizations: Not on file    Relationship status: Not on file  . Intimate partner violence:    Fear of current or ex partner: Not on file      Emotionally abused: Not on file    Physically abused: Not on file    Forced sexual activity: Not on file  Other Topics Concern  . Not on file  Social History Narrative  . Not on file   Environmental History: The patient lives in a 68 year old house with carpeting throughout, gas heat, and central air.  There is no known mold/water damage in the home.  He is a non-smoker without pets.  Allergies as of 02/22/2018      Reactions   Statins Other (See Comments)   Muscle cramps   Vytorin [ezetimibe-simvastatin] Other (See Comments)   Muscle cramps      Medication List        Accurate as of 02/22/18 12:39 PM. Always use your most recent med list.          diclofenac sodium 1 % Gel Commonly known as:  VOLTAREN Apply 1 application topically 3 (three) times daily as needed (for pain).   doxazosin 1 MG tablet Commonly known as:  CARDURA Take 1 mg by mouth daily.   famotidine 20 MG tablet Commonly known as:  PEPCID Take 1 tablet (20 mg total) by mouth 2 (two) times daily.   fluticasone 50 MCG/ACT nasal spray Commonly known as:  FLONASE 1 spray 1-2 times daily in each nostril as needed.   levocetirizine 5 MG tablet Commonly known as:  XYZAL Take 1 tablet (5 mg total) by mouth daily.   losartan-hydrochlorothiazide 100-12.5 MG tablet Commonly known as:  HYZAAR Take 1 tablet by mouth daily.   meloxicam 15 MG tablet Commonly known as:  MOBIC       Known medication allergies: Allergies  Allergen Reactions  . Statins Other (See Comments)    Muscle cramps  . Vytorin [Ezetimibe-Simvastatin] Other (See Comments)    Muscle cramps    I appreciate the opportunity to take part in Pearce's care. Please do not hesitate to contact me with questions.  Sincerely,   R. Edgar Frisk, MD

## 2018-02-22 NOTE — Assessment & Plan Note (Signed)
   Aeroallergen avoidance measures have been discussed and provided in written form.  Levocetirizine has been prescribed (as above).  A prescription has been provided for fluticasone nasal spray, one spray per nostril 1-2 times daily as needed. Proper nasal spray technique has been discussed and demonstrated.  Nasal saline spray (i.e., Simply Saline) or nasal saline lavage (i.e., NeilMed) is recommended as needed and prior to medicated nasal sprays.

## 2018-02-22 NOTE — Patient Instructions (Addendum)
Recurrent urticaria Unclear etiology. Skin tests to select food allergens were negative today. NSAIDs and emotional stress commonly exacerbate urticaria but are not the underlying etiology in this case. Physical urticarias are negative by history (i.e. pressure-induced, temperature, vibration, solar, etc.). History and lesions are not consistent with urticaria pigmentosa so I am not suspicious for mastocytosis. There are no concomitant symptoms concerning for anaphylaxis or constitutional symptoms worrisome for an underlying malignancy. We will rule out other potential etiologies with labs. For symptom relief, patient is to take oral antihistamines as directed.  The following labs have been ordered: Anti-thyroglobulin antibody, thyroid peroxidase antibody, tryptase, urea breath test, CBC, CMP, ESR, ANA, and galactose-alpha-1,3-galactose IgE level.  The patient will be called with further recommendations after lab results have returned.  Instructions have been discussed and provided for H1/H2 receptor blockade with titration to find lowest effective dose.  A prescription has been provided for levocetirizine, 5 mg daily as needed.  A prescription has been provided for famotidine, 20 mg twice daily as needed.  Should there be a significant increase or change in symptoms, a journal is to be kept recording any foods eaten, beverages consumed, medications taken within a 6 hour period prior to the onset of symptoms, as well as record activities being performed, and environmental conditions. For any symptoms concerning for anaphylaxis, 911 is to be called immediately.  Seasonal and perennial allergic rhinitis  Aeroallergen avoidance measures have been discussed and provided in written form.  Levocetirizine has been prescribed (as above).  A prescription has been provided for fluticasone nasal spray, one spray per nostril 1-2 times daily as needed. Proper nasal spray technique has been discussed and  demonstrated.  Nasal saline spray (i.e., Simply Saline) or nasal saline lavage (i.e., NeilMed) is recommended as needed and prior to medicated nasal sprays.   When lab results have returned the patient will be called with further recommendations and follow up instructions.  Urticaria (Hives)  . Levocetirizine (Xyzal) 5 mg twice a day and famotidine (Pepcid) 20 mg twice a day. If no symptoms for 7-14 days then decrease to. . Levocetirizine (Xyzal) 5 mg twice a day and famotidine (Pepcid) 20 mg once a day.  If no symptoms for 7-14 days then decrease to. . Levocetirizine (Xyzal) 5 mg twice a day.  If no symptoms for 7-14 days then decrease to. . Levocetirizine (Xyzal) 5 mg once a day.  If levocetirizine causes somnolence, may use fexofenadine (Allegra) 180 mg instead.   May use Benadryl (diphenhydramine) as needed for breakthrough symptoms       If symptoms return, then step up dosage   Reducing Pollen Exposure  The American Academy of Allergy, Asthma and Immunology suggests the following steps to reduce your exposure to pollen during allergy seasons.    1. Do not hang sheets or clothing out to dry; pollen may collect on these items. 2. Do not mow lawns or spend time around freshly cut grass; mowing stirs up pollen. 3. Keep windows closed at night.  Keep car windows closed while driving. 4. Minimize morning activities outdoors, a time when pollen counts are usually at their highest. 5. Stay indoors as much as possible when pollen counts or humidity is high and on windy days when pollen tends to remain in the air longer. 6. Use air conditioning when possible.  Many air conditioners have filters that trap the pollen spores. 7. Use a HEPA room air filter to remove pollen form the indoor air you breathe.   Control  of [place] dust mites play a major role in allergic asthma and rhinitis.  They occur in environments with high humidity wherever human skin, the food for  dust mites is found. High levels have been detected in dust obtained from mattresses, pillows, carpets, upholstered furniture, bed covers, clothes and soft toys.  The principal allergen of the house dust mite is found in its feces.  A gram of dust may contain 1,000 mites and 250,000 fecal particles.  Mite antigen is easily measured in the air during house cleaning activities.    1. Encase mattresses, including the box spring, and pillow, in an air tight cover.  Seal the zipper end of the encased mattresses with wide adhesive tape. 2. Wash the bedding in water of 130 degrees Farenheit weekly.  Avoid cotton comforters/quilts and flannel bedding: the most ideal bed covering is the dacron comforter. 3. Remove all upholstered furniture from the bedroom. 4. Remove carpets, carpet padding, rugs, and non-washable window drapes from the bedroom.  Wash drapes weekly or use plastic window coverings. 5. Remove all non-washable stuffed toys from the bedroom.  Wash stuffed toys weekly. 6. Have the room cleaned frequently with a vacuum cleaner and a damp dust-mop.  The patient should not be in a room which is being cleaned and should wait 1 hour after cleaning before going into the room. 7. Close and seal all heating outlets in the bedroom.  Otherwise, the room will become filled with dust-laden air.  An electric heater can be used to heat the room. 8. Reduce indoor humidity to less than 50%.  Do not use a humidifier.

## 2018-02-24 LAB — H. PYLORI BREATH TEST: H pylori Breath Test: NEGATIVE

## 2018-02-25 LAB — SEDIMENTATION RATE: Sed Rate: 9 mm/hr (ref 0–30)

## 2018-02-25 LAB — COMPREHENSIVE METABOLIC PANEL
ALT: 29 IU/L (ref 0–44)
AST: 25 IU/L (ref 0–40)
Albumin/Globulin Ratio: 2 (ref 1.2–2.2)
Albumin: 4.9 g/dL — ABNORMAL HIGH (ref 3.6–4.8)
Alkaline Phosphatase: 64 IU/L (ref 39–117)
BUN/Creatinine Ratio: 21 (ref 10–24)
BUN: 18 mg/dL (ref 8–27)
Bilirubin Total: 0.4 mg/dL (ref 0.0–1.2)
CO2: 23 mmol/L (ref 20–29)
Calcium: 9.8 mg/dL (ref 8.6–10.2)
Chloride: 103 mmol/L (ref 96–106)
Creatinine, Ser: 0.86 mg/dL (ref 0.76–1.27)
GFR calc Af Amer: 103 mL/min/{1.73_m2} (ref >59)
GFR calc non Af Amer: 89 mL/min/{1.73_m2} (ref >59)
Globulin, Total: 2.5 g/dL (ref 1.5–4.5)
Glucose: 86 mg/dL (ref 65–99)
Potassium: 4.5 mmol/L (ref 3.5–5.2)
Sodium: 141 mmol/L (ref 134–144)
Total Protein: 7.4 g/dL (ref 6.0–8.5)

## 2018-02-25 LAB — THYROID PEROXIDASE ANTIBODY: Thyroperoxidase Ab SerPl-aCnc: 15 IU/mL (ref 0–34)

## 2018-02-25 LAB — CBC
Hematocrit: 44.1 % (ref 37.5–51.0)
Hemoglobin: 15 g/dL (ref 13.0–17.7)
MCH: 31.3 pg (ref 26.6–33.0)
MCHC: 34 g/dL (ref 31.5–35.7)
MCV: 92 fL (ref 79–97)
Platelets: 214 10*3/uL (ref 150–450)
RBC: 4.8 x10E6/uL (ref 4.14–5.80)
RDW: 13.5 % (ref 12.3–15.4)
WBC: 4.9 10*3/uL (ref 3.4–10.8)

## 2018-02-25 LAB — ALPHA-GAL PANEL
Alpha Gal IgE*: <0.1 kU/L (ref <0.10)
Beef (Bos spp) IgE: <0.1 kU/L (ref <0.35)
Class Interpretation: 0
Class Interpretation: 0
Class Interpretation: 0
Lamb/Mutton (Ovis spp) IgE: <0.1 kU/L (ref <0.35)
Pork (Sus spp) IgE: <0.1 kU/L (ref <0.35)

## 2018-02-25 LAB — THYROGLOBULIN ANTIBODY: Thyroglobulin Antibody: <1 IU/mL (ref 0.0–0.9)

## 2018-02-25 LAB — TRYPTASE: Tryptase: 3.9 ug/L (ref 2.2–13.2)

## 2018-02-25 LAB — ANA W/REFLEX: Anti Nuclear Antibody(ANA): NEGATIVE

## 2018-05-10 ENCOUNTER — Other Ambulatory Visit: Payer: Self-pay | Admitting: *Deleted

## 2018-05-10 MED ORDER — FLUTICASONE PROPIONATE 50 MCG/ACT NA SUSP: NASAL | 1 refills | Status: AC

## 2018-05-10 MED ORDER — LEVOCETIRIZINE DIHYDROCHLORIDE 5 MG PO TABS: 5.0000 mg | ORAL_TABLET | Freq: Every day | ORAL | 1 refills | Status: DC

## 2018-07-08 ENCOUNTER — Other Ambulatory Visit: Payer: Self-pay | Admitting: Sports Medicine

## 2018-07-08 DIAGNOSIS — M545 Low back pain, unspecified: Secondary | ICD-10-CM

## 2018-07-22 ENCOUNTER — Ambulatory Visit
Admission: RE | Admit: 2018-07-22 | Discharge: 2018-07-22 | Disposition: A | Discharge status: Home / Self Care | Payer: Medicare Other | Source: Ambulatory Visit | Attending: Sports Medicine | Admitting: Sports Medicine

## 2018-07-22 ENCOUNTER — Other Ambulatory Visit: Payer: Self-pay | Admitting: Sports Medicine

## 2018-07-22 DIAGNOSIS — M545 Low back pain, unspecified: Secondary | ICD-10-CM

## 2018-08-16 ENCOUNTER — Ambulatory Visit: Payer: Self-pay | Admitting: Cardiology

## 2018-08-17 NOTE — Progress Notes (Signed)
Subjective:   Joseph Massey, male    DOB: [birth year], 68 y.o.   MRN: [id number]  Chief Complaint  Patient presents with  . New Patient (Initial Visit)     Patient referred by Vernie Shanks for preoperative cardiac risk stratification.  HPI: He has history of hypertension, hyperlipidemia, obstructive sleep apnea on CPAP, prediabetes, family history of hypertrophic cardiomyopathy, and known first degree AV block.   Patient is planning to have right shoulder surgery in the near future with Dr. Percell Miller. Had hip replacement in 2019 without any perioperative complications. States that hypertension and hyperlipidemia are well controlled. He previously saw Dr. Wynonia Lawman in 2018 and had echocardiogram performed that was normal. Has family history of hypertrophic cardiomyopathy.   He denies any symptoms. No chest pain, dyspnea on exertion, palpitations, leg edema, PND, orthopnea, or symptoms of claudication or TIA.   He does not exercise as his activity level is limited by OA. Does do activities around his house.   Past Medical History:  Diagnosis Date  . Arthritis   . BPH (benign prostatic hyperplasia)   . ED (erectile dysfunction)   . Gout   . History of kidney stones   . Hyperlipidemia   . Hypertension   . Obesity   . Recurrent upper respiratory infection (URI)   . Recurrent urticaria 02/22/2018  . Sleep apnea   . UC (ulcerative colitis) [facility])     Past Surgical History:  Procedure Laterality Date  . COLONOSCOPY N/A 02/15/2013   Procedure: COLONOSCOPY;  Surgeon: Garlan Fair, MD;  Location: [facility];  Service: Endoscopy;  Laterality: N/A;  . dental implants    . SINOSCOPY    . TONSILLECTOMY    . TOTAL HIP ARTHROPLASTY Right 07/14/2017   Procedure: RIGHT TOTAL HIP ARTHROPLASTY ANTERIOR APPROACH;  Surgeon: Renette Butters, MD;  Location: [place];  Service: Orthopedics;  Laterality: Right;    Family History  Problem Relation Age of Onset  . Heart attack Father   .  Allergic rhinitis Neg Hx   . Asthma Neg Hx   . Eczema Neg Hx   . Urticaria Neg Hx     Social History   Socioeconomic History  . Marital status: Married    Spouse name: Not on file  . Number of children: 1  . Years of education: Not on file  . Highest education level: Not on file  Occupational History  . Not on file  Social Needs  . Financial resource strain: Not on file  . Food insecurity:    Worry: Not on file    Inability: Not on file  . Transportation needs:    Medical: Not on file    Non-medical: Not on file  Tobacco Use  . Smoking status: Never Smoker  . Smokeless tobacco: Never Used  Substance and Sexual Activity  . Alcohol use: Yes    Alcohol/week: 2.0 standard drinks    Types: 2 Glasses of wine per week    Comment: per day  . Drug use: No  . Sexual activity: Not on file  Lifestyle  . Physical activity:    Days per week: Not on file    Minutes per session: Not on file  . Stress: Not on file  Relationships  . Social connections:    Talks on phone: Not on file    Gets together: Not on file    Attends religious service: Not on file    Active member of club or  organization: Not on file    Attends meetings of clubs or organizations: Not on file    Relationship status: Not on file  . Intimate partner violence:    Fear of current or ex partner: Not on file    Emotionally abused: Not on file    Physically abused: Not on file    Forced sexual activity: Not on file  Other Topics Concern  . Not on file  Social History Narrative  . Not on file    Current Outpatient Medications on File Prior to Visit  Medication Sig Dispense Refill  . allopurinol (ZYLOPRIM) 300 MG tablet Take 300 mg by mouth daily.    . diclofenac sodium (VOLTAREN) 1 % GEL Apply 1 application topically 3 (three) times daily as needed (for pain).   3  . doxazosin (CARDURA) 1 MG tablet Take 1 mg by mouth daily.     . fluticasone (FLONASE) 50 MCG/ACT nasal spray 1 spray 1-2 times daily in each  nostril as needed. 48 g 1  . levocetirizine (XYZAL) 5 MG tablet Take 1 tablet (5 mg total) by mouth daily. 90 tablet 1  . losartan-hydrochlorothiazide (HYZAAR) 100-12.5 MG tablet Take 1 tablet by mouth daily.    . meloxicam (MOBIC) 15 MG tablet     . famotidine (PEPCID) 20 MG tablet Take 1 tablet (20 mg total) by mouth 2 (two) times daily. (Patient not taking: Reported on 08/18/2018) 60 tablet 5   No current facility-administered medications on file prior to visit.      Review of Systems  Constitution: Negative for decreased appetite, malaise/fatigue, weight gain and weight loss.  Eyes: Negative for visual disturbance.  Cardiovascular: Negative for chest pain, claudication, dyspnea on exertion, leg swelling, orthopnea, palpitations and syncope.  Respiratory: Negative for hemoptysis and wheezing.   Endocrine: Negative for cold intolerance and heat intolerance.  Hematologic/Lymphatic: Does not bruise/bleed easily.  Skin: Negative for nail changes.  Musculoskeletal: Positive for joint pain. Negative for muscle weakness and myalgias.  Gastrointestinal: Negative for abdominal pain, change in bowel habit, nausea and vomiting.  Neurological: Negative for difficulty with concentration, dizziness, focal weakness and headaches.  Psychiatric/Behavioral: Negative for altered mental status and suicidal ideas.  All other systems reviewed and are negative.      Objective:     Blood pressure 115/76, pulse 66, height '5\' 10"'  (1.778 m), weight 239 lb 3.2 oz (108.5 kg), SpO2 97 %.  Physical Exam  Constitutional: He appears well-developed and well-nourished. No distress.  HENT:  Head: Atraumatic.  Eyes: Conjunctivae are normal.  Neck: Neck supple. No JVD present. No thyromegaly present.  Cardiovascular: Normal rate, regular rhythm, normal heart sounds and intact distal pulses. Exam reveals no gallop.  No murmur heard. Pulmonary/Chest: Effort normal and breath sounds normal.  Abdominal: Soft. Bowel  sounds are normal.  Musculoskeletal: Normal range of motion.        General: No edema.  Neurological: He is alert.  Skin: Skin is warm and dry.  Psychiatric: He has a normal mood and affect.      Cardiac studies:   Echocardiogram 05/22/2017: 1. Mild concentric LVH with normal global wall motion. Estimated EF 65%.  2. Mild (Grade I) mitral regurgitation. 3. Trace tricuspid regurgitation.  4. IVC is dilated with respiratory variation.   EKG 08/18/2018: Normal sinus rhythm at 70 bpm with first degree AV block, normal axis, nonspecific T abnormality.    Recent Labs:  04/09/2018: Glucose 101. BUN/Cr 13/0.77. eGFR 100/121. Na/K 139/4.7. AST 23. ALT  36. Rest of CMP normal.  Chol 179. TG 69. HDL 51.  LDL 115. HbA1c 6.0.  TSH 2.79. Ferritin 138. Uric Acid 5.2.  Vitamin B12 253.  11/23/17: Antinuclear Antibodies, IFA Negative.   CBC Latest Ref Rng & Units 02/22/2018  WBC 3.4 - 10.8 x10E3/uL 4.9  Hemoglobin 13.0 - 17.7 g/dL 15.0  Hematocrit 37.5 - 51.0 % 44.1  Platelets 150 - 450 x10E3/uL 214    Assessment & Recommendations:  1. Preoperative cardiovascular examination Tentatively scheduled for right shoulder surgery with Dr. Percell Miller in near future. He has recently underwent hip replacement surgery 1 year ago without perioperative CV complications. Risk factors are well controlled and he is asymptomatic. I do not feel that stress testing would change management. Feel that patient may be taken for surgery low risk. Echo from Dr. Wynonia Lawman office, no evidence of cardiomyopathy. Has known first degree AV block.    2. Essential hypertension Blood pressure is well controlled. Continue with current medications.  3. First degree AV block Present for several years and unchanged.  4. Mixed hyperlipidemia Well controlled with current therapy.   5. Family history of hypertrophic cardiomyopathy Father had hypertrophic cardiomyopathy by patients report and Dr. Wynonia Lawman note. No evidence of  cardiomyopathy by echo.   Plan: Will send clearance letter to Dr. Percell Miller. We will see him back on a PRN basis.                                          Thank you for referring this patient. Please do not hesitate to contact me for any questions.    *I have discussed this case with Dr. Virgina Jock and he personally examined the patient and participated in formulating the plan.Jeri Lager, MSN, APRN, FNP-C [facility], [place] Office: [phone] Fax: [phone]

## 2018-08-18 ENCOUNTER — Ambulatory Visit: Payer: Self-pay | Admitting: Cardiology

## 2018-08-18 ENCOUNTER — Other Ambulatory Visit: Payer: Self-pay

## 2018-08-18 ENCOUNTER — Ambulatory Visit (INDEPENDENT_AMBULATORY_CARE_PROVIDER_SITE_OTHER): Payer: Medicare Other | Admitting: Cardiology

## 2018-08-18 ENCOUNTER — Encounter: Payer: Self-pay | Admitting: Cardiology

## 2018-08-18 VITALS — BP 115/76 | HR 66 | Ht 70.0 in | Wt 239.2 lb

## 2018-08-18 DIAGNOSIS — I440 Atrioventricular block, first degree: Secondary | ICD-10-CM | POA: Diagnosis not present

## 2018-08-18 DIAGNOSIS — I10 Essential (primary) hypertension: Secondary | ICD-10-CM

## 2018-08-18 DIAGNOSIS — Z01810 Encounter for preprocedural cardiovascular examination: Secondary | ICD-10-CM | POA: Diagnosis not present

## 2018-08-18 DIAGNOSIS — E782 Mixed hyperlipidemia: Secondary | ICD-10-CM

## 2018-08-18 DIAGNOSIS — Z8249 Family history of ischemic heart disease and other diseases of the circulatory system: Secondary | ICD-10-CM

## 2018-08-18 NOTE — H&P (Signed)
SHOULDER ARTHROPLASTY ADMISSION H&P  Patient ID: Joseph Massey MRN: [id number] DOB/AGE: 1950/06/16 68 y.o.  Chief Complaint: right shoulder pain.  Planned Procedure Date: 09/14/2018 Medical Clearance by Dr. Jacelyn Grip Cardiac Clearance by Dr. Einar Gip pending   HPI: Joseph Massey is a 68 y.o. male with a remote history of ulcerative colitis.  History of OSA on CPAP, gout, HTN, severe right shoulder OA, right knee OA.  Today he presents for evaluation of OA RIGHT SHOULDER. The patient has a history of pain and functional disability in the right shoulder due to arthritis and has failed non-surgical conservative treatments for greater than 12 weeks to include NSAID's and/or analgesics, corticosteriod injections and activity modification.  Onset of symptoms was gradual, starting 3 + years ago with gradually worsening course since that time.  Patient currently rates pain at 8 out of 10 with activity. Patient has night pain, worsening of pain with activity and weight bearing, pain that interferes with activities of daily living and pain with passive range of motion.  Patient has evidence of subchondral cysts, subchondral sclerosis, periarticular osteophytes and joint space narrowing by imaging studies.  There is no active infection.  Past Medical History:  Diagnosis Date  . Arthritis   . BPH (benign prostatic hyperplasia)   . ED (erectile dysfunction)   . Gout   . History of kidney stones   . Hyperlipidemia   . Hypertension   . Obesity   . Recurrent upper respiratory infection (URI)   . Recurrent urticaria 02/22/2018  . Sleep apnea   . UC (ulcerative colitis) [facility])    Past Surgical History:  Procedure Laterality Date  . COLONOSCOPY N/A 02/15/2013   Procedure: COLONOSCOPY;  Surgeon: Garlan Fair, MD;  Location: [facility];  Service: Endoscopy;  Laterality: N/A;  . dental implants    . SINOSCOPY    . TONSILLECTOMY    . TOTAL HIP ARTHROPLASTY Right 07/14/2017   Procedure: RIGHT TOTAL HIP  ARTHROPLASTY ANTERIOR APPROACH;  Surgeon: Renette Butters, MD;  Location: [place];  Service: Orthopedics;  Laterality: Right;   Allergies  Allergen Reactions  . Statins Other (See Comments)    Muscle cramps  . Vytorin [Ezetimibe-Simvastatin] Other (See Comments)    Muscle cramps   Medications: Losartan/HCTZ 100-12.5 mg daily Allopurinol 300 mg daily. Doxazosin 1 mg daily. Mobic 15 mg daily Pepcid AC daily Xyzal daily prn  Social history: Married traveling Merchandiser, retail.  Never smoker.  Daily EtOH-2-3 drinks per day.  Family history: Mother with cancer.  Father with heart disease, MI, DM.  Grandparents with cancer.  ROS: Currently denies lightheadedness, dizziness, Fever, chills, CP, SOB.   No personal history of DVT, PE, MI, or CVA. No loose teeth or dentures All other systems have been reviewed and were otherwise currently negative with the exception of those mentioned in the HPI and as above.  Objective: Vitals: Ht: 5'10" Wt: 236 Temp: 97.9 BP: 119/76 Pulse: 76 O2 97% on room air.   Physical Exam: General: Alert, NAD.   HEENT: EOMI, Good Neck Extension  Pulm: No increased work of breathing.  Clear B/L A/P w/o crackle or wheeze.  CV: RRR, No m/g/r appreciated  GI: soft, NT, ND Neuro: Neuro without gross focal deficit.  Sensation intact distally Skin: No lesions in the area of chief complaint MSK/Surgical Site: right shoulder pain with range of motion.  Forward flexion/abduction approximately 95 degrees.  Internal rotation to back pocket.  External rotation to 15 degrees.  Mild AC and Biceps  pain.  Decent Rotator cuff strength.  NVI distally.  Imaging Review Plain radiographs demonstrate severe degenerative joint disease of the right shoulder.    Assessment: OA RIGHT SHOULDER Principal Problem:   Primary osteoarthritis, right shoulder Active Problems:   Primary osteoarthritis of right knee   Gout   Essential hypertension   Seasonal and perennial allergic  rhinitis   Plan: Plan for Procedure(s): TOTAL SHOULDER ARTHROPLASTY  The patient history, physical exam, clinical judgement of the provider and imaging are consistent with end stage degenerative joint disease and Anatomic vs Reverse total joint arthroplasty is deemed medically necessary. The treatment options including medical management, injection therapy, and arthroplasty were discussed at length. The risks and benefits of Procedure(s): TOTAL SHOULDER ARTHROPLASTY were presented and reviewed.  The risks of nonoperative treatment, versus surgical intervention including but not limited to continued pain, aseptic loosening, stiffness, dislocation/subluxation, infection, bleeding, nerve injury, blood clots, cardiopulmonary complications, morbidity, mortality, among others were discussed. The patient verbalizes understanding and wishes to proceed with the plan.  Patient is being admitted for inpatient treatment for surgery, pain control, prophylactic antibiotics, VTE prophylaxis, progressive ambulation, ADL's and discharge planning.   Dental prophylaxis discussed and recommended for 2 years postoperatively.   The patient does meet the criteria for TXA which will be used perioperatively.    ASA will be used postoperatively while inpatient for DVT prophylaxis in addition to SCDs, and early ambulation.  Order CPAP for OSA.  Plan for Tylenol, Gabapentin, and oxycodone for pain.  Rx for Celebrex / omeprazole for Pain, Inflammation / gastric protection.  He may resume Mobic after Celebrex and continue gastric protection.  The patient is planning to be discharged home in care of his wife.  Anticipated LOS less than 2 midnights.  Approved for inpatient by insurance. - Age 69 and older with one or more of the following:  - Obesity  - Expected need for hospital services (OT, Nursing) required for safe discharge.    Prudencio Burly III, PA-C 08/18/2018 5:57 PM

## 2018-08-20 ENCOUNTER — Encounter: Payer: Self-pay | Admitting: Cardiology

## 2018-08-20 DIAGNOSIS — E782 Mixed hyperlipidemia: Secondary | ICD-10-CM | POA: Insufficient documentation

## 2018-08-20 DIAGNOSIS — Z01810 Encounter for preprocedural cardiovascular examination: Secondary | ICD-10-CM | POA: Insufficient documentation

## 2018-08-20 DIAGNOSIS — Z8249 Family history of ischemic heart disease and other diseases of the circulatory system: Secondary | ICD-10-CM | POA: Insufficient documentation

## 2018-08-23 ENCOUNTER — Other Ambulatory Visit: Payer: Self-pay

## 2018-08-23 MED ORDER — FAMOTIDINE 20 MG PO TABS: 20.0000 mg | ORAL_TABLET | Freq: Two times a day (BID) | ORAL | 2 refills | Status: DC

## 2018-09-07 ENCOUNTER — Other Ambulatory Visit (HOSPITAL_COMMUNITY): Payer: Medicare Other

## 2018-09-27 ENCOUNTER — Other Ambulatory Visit: Payer: Self-pay | Admitting: *Deleted

## 2018-09-28 ENCOUNTER — Encounter (HOSPITAL_COMMUNITY): Payer: Self-pay

## 2018-09-28 ENCOUNTER — Inpatient Hospital Stay (HOSPITAL_COMMUNITY): Admit: 2018-09-28 | Payer: Medicare Other | Admitting: Orthopedic Surgery

## 2018-09-28 SURGERY — ARTHROPLASTY, SHOULDER, TOTAL
Anesthesia: Choice | Laterality: Right

## 2018-10-14 NOTE — H&P (Signed)
SHOULDER ARTHROPLASTY ADMISSION H&P  Patient ID: Joseph Massey MRN: [id number] DOB/AGE: 1950/11/27 68 y.o.  Chief Complaint: right shoulder pain.  Planned Procedure Date: 09/14/2018 Medical Clearance by Dr. Jacelyn Grip Cardiac Clearance by Dr. Einar Gip    HPI: Joseph Massey is a 68 y.o. male with a remote history of ulcerative colitis. History of OSA on CPAP,gout, HTN, severe right shoulder OA, right knee OA.  Today he presents for evaluation of OA RIGHT SHOULDER. The patient has a history of pain and functional disability in the right shoulder due to arthritis and has failed non-surgical conservative treatments for greater than 12 weeks to include NSAID's and/or analgesics, corticosteriod injections and activity modification.  Onset of symptoms was gradual, starting 3 + years ago with gradually worsening course since that time.  Patient currently rates pain at 8 out of 10 with activity. Patient has night pain, worsening of pain with activity and weight bearing, pain that interferes with activities of daily living and pain with passive range of motion.  Patient has evidence of subchondral cysts, subchondral sclerosis, periarticular osteophytes and joint space narrowing by imaging studies.  There is no active infection.      Past Medical History:  Diagnosis Date  . Arthritis   . BPH (benign prostatic hyperplasia)   . ED (erectile dysfunction)   . Gout   . History of kidney stones   . Hyperlipidemia   . Hypertension   . Obesity   . Recurrent upper respiratory infection (URI)   . Recurrent urticaria 02/22/2018  . Sleep apnea   . UC (ulcerative colitis) [facility])         Past Surgical History:  Procedure Laterality Date  . COLONOSCOPY N/A 02/15/2013   Procedure: COLONOSCOPY;  Surgeon: Garlan Fair, MD;  Location: [facility];  Service: Endoscopy;  Laterality: N/A;  . dental implants    . SINOSCOPY    . TONSILLECTOMY    . TOTAL HIP ARTHROPLASTY Right 07/14/2017    Procedure: RIGHT TOTAL HIP ARTHROPLASTY ANTERIOR APPROACH;  Surgeon: Renette Butters, MD;  Location: [place];  Service: Orthopedics;  Laterality: Right;        Allergies  Allergen Reactions  . Statins Other (See Comments)    Muscle cramps  . Vytorin [Ezetimibe-Simvastatin] Other (See Comments)    Muscle cramps   Medications: Losartan/HCTZ 100-12.5 mg daily Allopurinol 300 mg daily. Doxazosin 1 mg daily. Mobic 15 mg daily Pepcid AC daily Xyzal daily prn  Social history: Married traveling Merchandiser, retail. Never smoker. Daily EtOH-2-3 drinks per day.  Family history: Mother with cancer. Father with heart disease, MI, DM. Grandparents with cancer.  ROS: Currently denies lightheadedness, dizziness, Fever, chills, CP, SOB.   No personal history of DVT, PE, MI, or CVA. No loose teeth or dentures All other systems have been reviewed and were otherwise currently negative with the exception of those mentioned in the HPI and as above.  Objective: Vitals: Ht: 5'10" Wt: 236 Temp: 97.9 BP: 119/76 Pulse: 76 O2 97% on room air.   Physical Exam: General: Alert, NAD.   HEENT: EOMI, Good Neck Extension  Pulm: No increased work of breathing.  Clear B/L A/P w/o crackle or wheeze.  CV: RRR, No m/g/r appreciated  GI: soft, NT, ND Neuro: Neuro without gross focal deficit.  Sensation intact distally Skin: No lesions in the area of chief complaint MSK/Surgical Site: right shoulder pain with range of motion.  Forward flexion/abduction approximately 95 degrees.  Internal rotation to back pocket.  External rotation  to 15 degrees.  Mild AC and Biceps pain.  Decent Rotator cuff strength.  NVI distally.  Imaging Review Plain radiographs demonstrate severe degenerative joint disease of the right shoulder.    Assessment: OA RIGHT SHOULDER Principal Problem:   Primary osteoarthritis, right shoulder Active Problems:   Primary osteoarthritis of right knee   Gout   Essential  hypertension   Seasonal and perennial allergic rhinitis   Plan: Plan for Procedure(s): TOTAL SHOULDER ARTHROPLASTY  The patient history, physical exam, clinical judgement of the provider and imaging are consistent with end stage degenerative joint disease and Anatomic vs Reverse total joint arthroplasty is deemed medically necessary. The treatment options including medical management, injection therapy, and arthroplasty were discussed at length. The risks and benefits of Procedure(s): TOTAL SHOULDER ARTHROPLASTY were presented and reviewed.  The risks of nonoperative treatment, versus surgical intervention including but not limited to continued pain, aseptic loosening, stiffness, dislocation/subluxation, infection, bleeding, nerve injury, blood clots, cardiopulmonary complications, morbidity, mortality, among others were discussed. The patient verbalizes understanding and wishes to proceed with the plan.  Patient is being admitted for inpatient treatment for surgery, pain control, prophylactic antibiotics, VTE prophylaxis, progressive ambulation, ADL's and discharge planning.   Dental prophylaxis discussed and recommended for 2 years postoperatively.   The patient does meet the criteria for TXA which will be used perioperatively.    ASA will be used postoperatively while inpatient for DVT prophylaxis in addition to SCDs, and early ambulation.  Order CPAP for OSA.  Plan for Tylenol, Gabapentin, and oxycodone for pain.  Rx for Celebrex / omeprazole for Pain, Inflammation / gastric protection.  He may resume Mobic after Celebrex and continue gastric protection.  The patient is planning to be discharged home in care of his wife.  Anticipated LOS less than 2 midnights.  Approved for inpatient by insurance. - Age 36 and older with one or more of the following:             - Obesity             - Expected need for hospital services (OT, Nursing) required for safe discharge.                Prudencio Burly III, PA-C 08/18/2018 5:57 PM

## 2018-10-22 NOTE — Progress Notes (Signed)
[facility] cardiology pre-op exam 08-18-2018 epic   ekg 08-18-2018 epic

## 2018-10-22 NOTE — Patient Instructions (Signed)
YOU ARE REQUIRED TO BE TESTED FOR COVID-19 PRIOR TO YOUR SURGERY . YOUR TEST MUST BE COMPLETED ON Friday Oct 29, 2018. TESTING IS LOCATED AT [place] ENTRANCE FROM 9:00AM - 3:00PM. FAILURE TO COMPLETE TESTING MAY RESULT IN CANCELLATION OF YOUR SURGERY.                  Joseph Massey    Your procedure is scheduled on: 11-02-2018   Report to [facility] Main  Entrance     Report to admitting at 7:30AM     Call this number if you have problems the morning of surgery [phone]      Remember: Do not eat food or drink liquids :After Midnight. BRUSH YOUR TEETH MORNING OF SURGERY AND RINSE YOUR MOUTH OUT, NO CHEWING GUM CANDY OR MINTS.     Take these medicines the morning of surgery with A SIP OF WATER: allpurinol,  DO NOT TAKE ANY DIABETIC MEDICATIONS DAY OF YOUR SURGERY                               You may not have any metal on your body including hair pins and              piercings  Do not wear jewelry, make-up, lotions, powders or perfumes, deodorant             Do not wear nail polish.  Do not shave  48 hours prior to surgery.              Men may shave face and neck.   Do not bring valuables to the hospital. [employer].  Contacts, dentures or bridgework may not be worn into surgery.  Leave suitcase in the car. After surgery it may be brought to your room.     Patients discharged the day of surgery will not be allowed to drive home. IF YOU ARE HAVING SURGERY AND GOING HOME THE SAME DAY, YOU MUST HAVE AN ADULT TO DRIVE YOU HOME AND BE WITH YOU FOR 24 HOURS. YOU MAY GO HOME BY TAXI OR UBER OR ORTHERWISE, BUT AN ADULT MUST ACCOMPANY YOU HOME AND STAY WITH YOU FOR 24 HOURS.  Name and phone number of your driver:  Special Instructions: N/A              Please read over the following fact sheets you were given: _____________________________________________________________________              [facility] - Preparing for Surgery Before surgery, you can play an important role.  Because skin is not sterile, your skin needs to be as free of germs as possible.  You can reduce the number of germs on your skin by washing with CHG (chlorahexidine gluconate) soap before surgery.  CHG is an antiseptic cleaner which kills germs and bonds with the skin to continue killing germs even after washing. Please DO NOT use if you have an allergy to CHG or antibacterial soaps.  If your skin becomes reddened/irritated stop using the CHG and inform your nurse when you arrive at Short Stay. Do not shave (including legs and underarms) for at least 48 hours prior to the first CHG shower.  You may shave your face/neck. Please follow these instructions carefully:  1.  Shower with  CHG Soap the night before surgery and the  morning of Surgery.  2.  If you choose to wash your hair, wash your hair first as usual with your  normal  shampoo.  3.  After you shampoo, rinse your hair and body thoroughly to remove the  shampoo.                           4.  Use CHG as you would any other liquid soap.  You can apply chg directly  to the skin and wash                       Gently with a scrungie or clean washcloth.  5.  Apply the CHG Soap to your body ONLY FROM THE NECK DOWN.   Do not use on face/ open                           Wound or open sores. Avoid contact with eyes, ears mouth and genitals (private parts).                       Wash face,  Genitals (private parts) with your normal soap.             6.  Wash thoroughly, paying special attention to the area where your surgery  will be performed.  7.  Thoroughly rinse your body with warm water from the neck down.  8.  DO NOT shower/wash with your normal soap after using and rinsing off  the CHG Soap.                9.  Pat yourself dry with a clean towel.            10.  Wear clean pajamas.            11.  Place clean sheets on your bed the night of your first shower  and do not  sleep with pets. Day of Surgery : Do not apply any lotions/deodorants the morning of surgery.  Please wear clean clothes to the hospital/surgery center.  FAILURE TO FOLLOW THESE INSTRUCTIONS MAY RESULT IN THE CANCELLATION OF YOUR SURGERY PATIENT SIGNATURE_________________________________  NURSE SIGNATURE__________________________________  ________________________________________________________________________   Adam Phenix  An incentive spirometer is a tool that can help keep your lungs clear and active. This tool measures how well you are filling your lungs with each breath. Taking long deep breaths may help reverse or decrease the chance of developing breathing (pulmonary) problems (especially infection) following:  A long period of time when you are unable to move or be active. BEFORE THE PROCEDURE   If the spirometer includes an indicator to show your best effort, your nurse or respiratory therapist will set it to a desired goal.  If possible, sit up straight or lean slightly forward. Try not to slouch.  Hold the incentive spirometer in an upright position. INSTRUCTIONS FOR USE  1. Sit on the edge of your bed if possible, or sit up as far as you can in bed or on a chair. 2. Hold the incentive spirometer in an upright position. 3. Breathe out normally. 4. Place the mouthpiece in your mouth and seal your lips tightly around it. 5. Breathe in slowly and as deeply as possible, raising the piston or the ball toward the top of the column. 6. Hold your breath  for 3-5 seconds or for as long as possible. Allow the piston or ball to fall to the bottom of the column. 7. Remove the mouthpiece from your mouth and breathe out normally. 8. Rest for a few seconds and repeat Steps 1 through 7 at least 10 times every 1-2 hours when you are awake. Take your time and take a few normal breaths between deep breaths. 9. The spirometer may include an indicator to show your best  effort. Use the indicator as a goal to work toward during each repetition. 10. After each set of 10 deep breaths, practice coughing to be sure your lungs are clear. If you have an incision (the cut made at the time of surgery), support your incision when coughing by placing a pillow or rolled up towels firmly against it. Once you are able to get out of bed, walk around indoors and cough well. You may stop using the incentive spirometer when instructed by your caregiver.  RISKS AND COMPLICATIONS  Take your time so you do not get dizzy or light-headed.  If you are in pain, you may need to take or ask for pain medication before doing incentive spirometry. It is harder to take a deep breath if you are having pain. AFTER USE  Rest and breathe slowly and easily.  It can be helpful to keep track of a log of your progress. Your caregiver can provide you with a simple table to help with this. If you are using the spirometer at home, follow these instructions: [place] IF:   You are having difficultly using the spirometer.  You have trouble using the spirometer as often as instructed.  Your pain medication is not giving enough relief while using the spirometer.  You develop fever of 100.5 F (38.1 C) or higher. SEEK IMMEDIATE MEDICAL CARE IF:   You cough up bloody sputum that had not been present before.  You develop fever of 102 F (38.9 C) or greater.  You develop worsening pain at or near the incision site. MAKE SURE YOU:   Understand these instructions.  Will watch your condition.  Will get help right away if you are not doing well or get worse. Document Released: 09/29/2006 Document Revised: 08/11/2011 Document Reviewed: 11/30/2006 [facility] Patient Information 2014 [place], [place].   ________________________________________________________________________

## 2018-10-23 ENCOUNTER — Other Ambulatory Visit: Payer: Self-pay | Admitting: Allergy and Immunology

## 2018-10-26 ENCOUNTER — Other Ambulatory Visit: Payer: Self-pay | Admitting: Family Medicine

## 2018-10-26 ENCOUNTER — Encounter (HOSPITAL_COMMUNITY)
Admission: RE | Admit: 2018-10-26 | Discharge: 2018-10-26 | Disposition: A | Discharge status: Home / Self Care | Payer: Medicare Other | Source: Ambulatory Visit | Attending: Orthopedic Surgery | Admitting: Orthopedic Surgery

## 2018-10-26 DIAGNOSIS — R899 Unspecified abnormal finding in specimens from other organs, systems and tissues: Secondary | ICD-10-CM

## 2018-10-26 DIAGNOSIS — R17 Unspecified jaundice: Secondary | ICD-10-CM

## 2018-10-27 ENCOUNTER — Other Ambulatory Visit: Payer: Self-pay

## 2018-10-27 ENCOUNTER — Ambulatory Visit
Admission: RE | Admit: 2018-10-27 | Discharge: 2018-10-27 | Disposition: A | Discharge status: Home / Self Care | Payer: Medicare Other | Source: Ambulatory Visit | Attending: Family Medicine | Admitting: Family Medicine

## 2018-10-27 DIAGNOSIS — R17 Unspecified jaundice: Secondary | ICD-10-CM

## 2018-10-27 DIAGNOSIS — R899 Unspecified abnormal finding in specimens from other organs, systems and tissues: Secondary | ICD-10-CM

## 2018-10-28 ENCOUNTER — Other Ambulatory Visit: Payer: Self-pay | Admitting: Gastroenterology

## 2018-10-28 DIAGNOSIS — R17 Unspecified jaundice: Secondary | ICD-10-CM

## 2018-10-28 DIAGNOSIS — R748 Abnormal levels of other serum enzymes: Secondary | ICD-10-CM

## 2018-10-28 DIAGNOSIS — R634 Abnormal weight loss: Secondary | ICD-10-CM

## 2018-10-29 ENCOUNTER — Ambulatory Visit (HOSPITAL_COMMUNITY)
Admission: RE | Admit: 2018-10-29 | Discharge: 2018-10-29 | Disposition: A | Discharge status: Home / Self Care | Payer: Medicare Other | Source: Ambulatory Visit | Attending: Gastroenterology | Admitting: Gastroenterology

## 2018-10-29 ENCOUNTER — Other Ambulatory Visit: Payer: Self-pay

## 2018-10-29 ENCOUNTER — Other Ambulatory Visit (HOSPITAL_COMMUNITY)
Admission: RE | Admit: 2018-10-29 | Discharge: 2018-10-29 | Disposition: A | Discharge status: Home / Self Care | Payer: Medicare Other | Source: Ambulatory Visit | Attending: Gastroenterology | Admitting: Gastroenterology

## 2018-10-29 ENCOUNTER — Other Ambulatory Visit: Payer: Self-pay | Admitting: Gastroenterology

## 2018-10-29 DIAGNOSIS — R634 Abnormal weight loss: Secondary | ICD-10-CM | POA: Diagnosis present

## 2018-10-29 DIAGNOSIS — R748 Abnormal levels of other serum enzymes: Secondary | ICD-10-CM | POA: Diagnosis not present

## 2018-10-29 DIAGNOSIS — R17 Unspecified jaundice: Secondary | ICD-10-CM | POA: Diagnosis not present

## 2018-10-29 DIAGNOSIS — Z1159 Encounter for screening for other viral diseases: Secondary | ICD-10-CM | POA: Insufficient documentation

## 2018-10-29 LAB — POCT I-STAT CREATININE: Creatinine, Ser: 0.7 mg/dL (ref 0.61–1.24)

## 2018-10-29 MED ORDER — GADOBUTROL 1 MMOL/ML IV SOLN
10.0000 mL | Freq: Once | INTRAVENOUS | Status: AC | PRN
  Administered 2018-10-29: 10 mL via INTRAVENOUS

## 2018-10-30 LAB — NOVEL CORONAVIRUS, NAA (HOSP ORDER, SEND-OUT TO REF LAB; TAT 18-24 HRS): SARS-CoV-2, NAA: NOT DETECTED

## 2018-11-01 ENCOUNTER — Encounter (HOSPITAL_COMMUNITY): Payer: Self-pay | Admitting: *Deleted

## 2018-11-01 ENCOUNTER — Other Ambulatory Visit: Payer: Self-pay

## 2018-11-01 NOTE — Progress Notes (Signed)
Spoke with patient, has been self isolating since testing. Has not left the house or had any visitors.  Confirmed pre op instructions and location.

## 2018-11-02 ENCOUNTER — Ambulatory Visit (HOSPITAL_COMMUNITY): Payer: Medicare Other | Admitting: Certified Registered"

## 2018-11-02 ENCOUNTER — Inpatient Hospital Stay (HOSPITAL_COMMUNITY): Admission: RE | Admit: 2018-11-02 | Payer: Medicare Other | Source: Home / Self Care | Admitting: Orthopedic Surgery

## 2018-11-02 ENCOUNTER — Encounter (HOSPITAL_COMMUNITY): Admission: RE | Payer: Self-pay | Source: Home / Self Care

## 2018-11-02 ENCOUNTER — Other Ambulatory Visit: Payer: Self-pay

## 2018-11-02 ENCOUNTER — Encounter (HOSPITAL_COMMUNITY)
Admission: RE | Disposition: A | Discharge status: Home / Self Care | Payer: Self-pay | Source: Home / Self Care | Attending: Gastroenterology

## 2018-11-02 ENCOUNTER — Ambulatory Visit (HOSPITAL_COMMUNITY)
Admission: RE | Admit: 2018-11-02 | Discharge: 2018-11-02 | Disposition: A | Discharge status: Home / Self Care | Payer: Medicare Other | Attending: Gastroenterology | Admitting: Gastroenterology

## 2018-11-02 ENCOUNTER — Ambulatory Visit (HOSPITAL_COMMUNITY): Payer: Medicare Other

## 2018-11-02 ENCOUNTER — Encounter (HOSPITAL_COMMUNITY): Payer: Self-pay

## 2018-11-02 DIAGNOSIS — R7989 Other specified abnormal findings of blood chemistry: Secondary | ICD-10-CM

## 2018-11-02 DIAGNOSIS — M109 Gout, unspecified: Secondary | ICD-10-CM | POA: Diagnosis not present

## 2018-11-02 DIAGNOSIS — N400 Enlarged prostate without lower urinary tract symptoms: Secondary | ICD-10-CM | POA: Diagnosis not present

## 2018-11-02 DIAGNOSIS — Z79899 Other long term (current) drug therapy: Secondary | ICD-10-CM | POA: Insufficient documentation

## 2018-11-02 DIAGNOSIS — K862 Cyst of pancreas: Secondary | ICD-10-CM | POA: Diagnosis not present

## 2018-11-02 DIAGNOSIS — I10 Essential (primary) hypertension: Secondary | ICD-10-CM | POA: Insufficient documentation

## 2018-11-02 DIAGNOSIS — K8689 Other specified diseases of pancreas: Secondary | ICD-10-CM | POA: Insufficient documentation

## 2018-11-02 DIAGNOSIS — K831 Obstruction of bile duct: Secondary | ICD-10-CM | POA: Insufficient documentation

## 2018-11-02 DIAGNOSIS — K269 Duodenal ulcer, unspecified as acute or chronic, without hemorrhage or perforation: Secondary | ICD-10-CM | POA: Diagnosis not present

## 2018-11-02 DIAGNOSIS — R634 Abnormal weight loss: Secondary | ICD-10-CM | POA: Diagnosis not present

## 2018-11-02 DIAGNOSIS — R748 Abnormal levels of other serum enzymes: Secondary | ICD-10-CM | POA: Insufficient documentation

## 2018-11-02 DIAGNOSIS — R17 Unspecified jaundice: Secondary | ICD-10-CM | POA: Insufficient documentation

## 2018-11-02 DIAGNOSIS — K3189 Other diseases of stomach and duodenum: Secondary | ICD-10-CM | POA: Insufficient documentation

## 2018-11-02 DIAGNOSIS — R9389 Abnormal findings on diagnostic imaging of other specified body structures: Secondary | ICD-10-CM | POA: Diagnosis not present

## 2018-11-02 DIAGNOSIS — Z96649 Presence of unspecified artificial hip joint: Secondary | ICD-10-CM | POA: Diagnosis not present

## 2018-11-02 DIAGNOSIS — E785 Hyperlipidemia, unspecified: Secondary | ICD-10-CM | POA: Diagnosis not present

## 2018-11-02 HISTORY — PX: BILIARY BRUSHING: SHX6843

## 2018-11-02 HISTORY — PX: PANCREATIC STENT PLACEMENT: SHX5539

## 2018-11-02 HISTORY — PX: ERCP: SHX5425

## 2018-11-02 HISTORY — PX: BILIARY STENT PLACEMENT: SHX5538

## 2018-11-02 HISTORY — PX: EUS: SHX5427

## 2018-11-02 HISTORY — DX: Unspecified jaundice: R17

## 2018-11-02 HISTORY — PX: SPHINCTEROTOMY: SHX5544

## 2018-11-02 HISTORY — PX: ESOPHAGOGASTRODUODENOSCOPY (EGD) WITH PROPOFOL: SHX5813

## 2018-11-02 SURGERY — ERCP, WITH INTERVENTION IF INDICATED
Anesthesia: General

## 2018-11-02 SURGERY — ARTHROPLASTY, SHOULDER, TOTAL
Anesthesia: Choice | Laterality: Right

## 2018-11-02 MED ORDER — LACTATED RINGERS IV SOLN
INTRAVENOUS | Status: DC
  Administered 2018-11-02 (×2): via INTRAVENOUS

## 2018-11-02 MED ORDER — CIPROFLOXACIN IN D5W 400 MG/200ML IV SOLN
INTRAVENOUS | Status: AC
  Filled 2018-11-02: qty 200

## 2018-11-02 MED ORDER — PROPOFOL 10 MG/ML IV BOLUS
INTRAVENOUS | Status: AC
  Filled 2018-11-02: qty 40

## 2018-11-02 MED ORDER — MIDAZOLAM HCL 5 MG/5ML IJ SOLN
INTRAMUSCULAR | Status: DC | PRN
  Administered 2018-11-02: 2 mg via INTRAVENOUS

## 2018-11-02 MED ORDER — PHENYLEPHRINE 40 MCG/ML (10ML) SYRINGE FOR IV PUSH (FOR BLOOD PRESSURE SUPPORT)
PREFILLED_SYRINGE | INTRAVENOUS | Status: DC | PRN
  Administered 2018-11-02: 120 ug via INTRAVENOUS
  Administered 2018-11-02: 160 ug via INTRAVENOUS
  Administered 2018-11-02: 120 ug via INTRAVENOUS
  Administered 2018-11-02: 80 ug via INTRAVENOUS
  Administered 2018-11-02: 40 ug via INTRAVENOUS
  Administered 2018-11-02: 120 ug via INTRAVENOUS
  Administered 2018-11-02 (×4): 80 ug via INTRAVENOUS

## 2018-11-02 MED ORDER — LIDOCAINE 2% (20 MG/ML) 5 ML SYRINGE
INTRAMUSCULAR | Status: DC | PRN
  Administered 2018-11-02: 60 mg via INTRAVENOUS

## 2018-11-02 MED ORDER — FENTANYL CITRATE (PF) 100 MCG/2ML IJ SOLN
INTRAMUSCULAR | Status: AC
  Filled 2018-11-02: qty 2

## 2018-11-02 MED ORDER — INDOMETHACIN 50 MG RE SUPP
RECTAL | Status: AC
  Filled 2018-11-02: qty 2

## 2018-11-02 MED ORDER — SODIUM CHLORIDE 0.9 % IV SOLN: INTRAVENOUS | Status: DC

## 2018-11-02 MED ORDER — SUCCINYLCHOLINE CHLORIDE 200 MG/10ML IV SOSY
PREFILLED_SYRINGE | INTRAVENOUS | Status: DC | PRN
  Administered 2018-11-02: 120 mg via INTRAVENOUS

## 2018-11-02 MED ORDER — MIDAZOLAM HCL 2 MG/2ML IJ SOLN
INTRAMUSCULAR | Status: AC
  Filled 2018-11-02: qty 2

## 2018-11-02 MED ORDER — GLUCAGON HCL RDNA (DIAGNOSTIC) 1 MG IJ SOLR
INTRAMUSCULAR | Status: AC
  Filled 2018-11-02: qty 1

## 2018-11-02 MED ORDER — INDOMETHACIN 50 MG RE SUPP
RECTAL | Status: DC | PRN
  Administered 2018-11-02: 100 mg via RECTAL

## 2018-11-02 MED ORDER — EPHEDRINE SULFATE-NACL 50-0.9 MG/10ML-% IV SOSY
PREFILLED_SYRINGE | INTRAVENOUS | Status: DC | PRN
  Administered 2018-11-02: 5 mg via INTRAVENOUS
  Administered 2018-11-02: 10 mg via INTRAVENOUS
  Administered 2018-11-02: 5 mg via INTRAVENOUS
  Administered 2018-11-02: 10 mg via INTRAVENOUS
  Administered 2018-11-02 (×2): 5 mg via INTRAVENOUS

## 2018-11-02 MED ORDER — FENTANYL CITRATE (PF) 100 MCG/2ML IJ SOLN
INTRAMUSCULAR | Status: DC | PRN
  Administered 2018-11-02: 50 ug via INTRAVENOUS
  Administered 2018-11-02: 25 ug via INTRAVENOUS
  Administered 2018-11-02: 50 ug via INTRAVENOUS
  Administered 2018-11-02: 25 ug via INTRAVENOUS

## 2018-11-02 MED ORDER — CIPROFLOXACIN IN D5W 400 MG/200ML IV SOLN
INTRAVENOUS | Status: DC | PRN
  Administered 2018-11-02: 400 mg via INTRAVENOUS

## 2018-11-02 MED ORDER — PROPOFOL 10 MG/ML IV BOLUS
INTRAVENOUS | Status: DC | PRN
  Administered 2018-11-02: 20 mg via INTRAVENOUS
  Administered 2018-11-02: 150 mg via INTRAVENOUS

## 2018-11-02 MED ORDER — ONDANSETRON HCL 4 MG/2ML IJ SOLN
INTRAMUSCULAR | Status: DC | PRN
  Administered 2018-11-02: 4 mg via INTRAVENOUS

## 2018-11-02 MED ORDER — SODIUM CHLORIDE 0.9 % IV SOLN
INTRAVENOUS | Status: DC | PRN
  Administered 2018-11-02: 20 mL

## 2018-11-02 NOTE — Anesthesia Procedure Notes (Signed)
Procedure Name: Intubation Date/Time: 11/02/2018 1:23 PM Performed by: Silas Sacramento, CRNA Pre-anesthesia Checklist: Patient identified, Emergency Drugs available, Suction available and Patient being monitored Patient Re-evaluated:Patient Re-evaluated prior to induction Oxygen Delivery Method: Circle system utilized Preoxygenation: Pre-oxygenation with 100% oxygen Induction Type: IV induction and Rapid sequence Laryngoscope Size: Mac and 4 Grade View: Grade I Tube type: Oral Tube size: 7.5 mm Number of attempts: 1 Airway Equipment and Method: Stylet and Oral airway Placement Confirmation: ETT inserted through vocal cords under direct vision,  positive ETCO2 and breath sounds checked- equal and bilateral Secured at: 24 cm Tube secured with: Tape Dental Injury: Teeth and Oropharynx as per pre-operative assessment

## 2018-11-02 NOTE — H&P (Signed)
Patient interval history reviewed.  Patient examined again.  There has been no change from documented H/P dated 10/28/2018 (scanned into chart from our office) except as documented above.  Assessment:  1.  Obstructive jaundice.  Plan:  1.  Endoscopic ultrasound with possible fine needle aspiration. 2.  Risks (bleeding, infection, bowel perforation that could require surgery, sedation-related changes in cardiopulmonary systems), benefits (identification and possible treatment of source of symptoms, exclusion of certain causes of symptoms), and alternatives (watchful waiting, radiographic imaging studies, empiric medical treatment) of upper endoscopy with ultrasound and possible fine needle aspiration (EUS +/- FNA) were explained to patient/family in detail and patient wishes to proceed. 3.  Endoscopic retrograde cholangiopancreatography with possible bile duct stent placement. 4.  Risks (up to and including bleeding, infection, perforation, pancreatitis that can be complicated by infected necrosis and death), benefits (removal of stones, alleviating blockage, decreasing risk of cholangitis or choledocholithiasis-related pancreatitis), and alternatives (watchful waiting, percutaneous transhepatic cholangiography) of ERCP were explained to patient/family in detail and patient elects to proceed.

## 2018-11-02 NOTE — Op Note (Signed)
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 Clarene Essex Medicines:                General Anesthesia Complications:            No immediate complications. Estimated Blood Loss:     Estimated blood loss: none. Procedure:                Pre-Anesthesia Assessment:                           - Prior to the procedure, a History and Physical                            was performed, and patient medications and                            allergies were reviewed. The patient's tolerance of                            previous anesthesia was also reviewed. The risks                            and benefits of the procedure and the sedation                            options and risks were discussed with the patient.                            All questions were answered, and informed consent                            was obtained. Prior Anticoagulants: The patient has                            taken no previous anticoagulant or antiplatelet                            agents. ASA  Grade Assessment: II - A patient with                            mild systemic disease. After reviewing the risks                            and benefits, the patient was deemed in                            satisfactory condition to undergo the procedure.                           After obtaining informed consent, the endoscope was                            passed under direct vision.  Throughout the                            procedure, the patient's blood pressure, pulse, and                            oxygen saturations were monitored continuously. The                            [id number] ([id number]) Olympus Linear EUS was                            introduced through the mouth, and advanced to the                            duodenal bulb. The upper EUS was accomplished                            without difficulty. The patient tolerated the                            procedure well. Scope In: Scope Out: Findings:      ENDOSCOPIC FINDING: :      Diffuse moderately congested mucosa without active bleeding and with no       stigmata of bleeding was found in the duodenal bulb.      One non-bleeding cratered duodenal ulcer with no stigmata of bleeding       was found in the first portion of the duodenum, with associated post       bulbar stricture. The lesion was 6 mm in largest dimension. Scope unable       to reach the post-bulbar duodenum.      ENDOSONOGRAPHIC FINDING: :      Moderate hyperechoic material consistent with sludge was visualized       endosonographically in the common bile duct and in the gallbladder.      There was dilation in the common hepatic duct which measured up to 10       mm. Common bile duct about 7 mm. The bile duct appeared a bit irregular       and was not smooth in texture.  Anechoic, multicystic and septated lesions suggestive of multiple cysts       were identified in the pancreatic head. The largest lesion measured 25       mm by 25 mm in maximal cross-sectional diameter in aggregate, the       largest cyst about 15 x 76mm in diameter. There was no associated mass.       Unable to discern communication between cyst and pancreatic duct. Cyst       about 67mm from the duodenal lumen, thus cyst aspiration       risk-prohibitive.      There was no other sign of significant endosonographic abnormality in       the pancreatic head,  pancreatic body and pancreatic tail. The pancreatic       duct measured up to 3 mm in diameter. Due to post-bulbar stricture,       unable to pass scope into second portion of duodenum, thus unable to       visualize uncinate pancreas. Impression:               - Congested duodenal mucosa.                           - Non-bleeding duodenal ulcer with no stigmata of                            bleeding, and associated post-bulbar duodenal                            stricture; EUS scope unable to reach second portion                            of duodenum.                           - Hyperechoic material consistent with sludge was                            visualized endosonographically in the common bile                            duct and in the gallbladder.                           - There was dilation in the common hepatic duct                            which measured up to 10 mm.                           - Multiple cystic lesions were seen in the                            pancreatic head.                           - There was no other sign of significant pathology  in the pancreatic head, pancreatic body and                            pancreatic tail.                           - Overall constellation of findings most suggestive                            of either macrocystic lesion (mucinous cystadenoma                            or IPMN) which has potentially degenerated into                            malignancy versus cholangiocarcinoma versus CBD                            stricture from [facility] versus uncinate pancreatic                            lesion (unable to visualize uncinate pancreas due                            to post-bulbar stricture). Moderate Sedation:      None Recommendation:           - Perform an ERCP today. Procedure Code(s):        --- Professional ---                           [phone], Esophagogastroduodenoscopy, flexible,                             transoral; with endoscopic ultrasound examination                            limited to the esophagus, stomach or duodenum, and                            adjacent structures Diagnosis Code(s):        --- Professional ---                           K31.89, Other diseases of stomach and duodenum                           K26.9, Duodenal ulcer, unspecified as acute or                            chronic, without hemorrhage or perforation                           K83.8, Other specified diseases of biliary tract                           K86.2, Cyst of pancreas  R74.8, Abnormal levels of other serum enzymes                           R63.4, Abnormal weight loss                           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 All rights reserved. The codes documented in this report are preliminary and upon coder review may  be revised to meet current compliance requirements. Arta Silence, MD 11/02/2018 2:11:13 PM This report has been signed electronically. Number of Addenda: 0

## 2018-11-02 NOTE — Op Note (Signed)
[facility] Patient Name: Joseph Massey Procedure Date: 11/02/2018 MRN: [id number] Attending MD: Clarene Essex , MD Date of Birth: [birth year] CSN: [id number] Age: 68 Admit Type: Outpatient Procedure:                ERCP Indications:              Abnormal MRCP, Jaundice questionable malignant                            stricture Providers:                Clarene Essex, MD, Burtis Junes, RN, William Dalton,                            Technician Referring MD:              Medicines:                General Anesthesia Complications:            No immediate complications. Estimated Blood Loss:     Estimated blood loss: none. Procedure:                Pre-Anesthesia Assessment:                           - Prior to the procedure, a History and Physical                            was performed, and patient medications and                            allergies were reviewed. The patient's tolerance of                            previous anesthesia was also reviewed. The risks                            and benefits of the procedure and the sedation                            options and risks were discussed with the patient.                            All questions were answered, and informed consent                            was obtained. Prior Anticoagulants: The patient has                            taken no previous anticoagulant or antiplatelet                            agents. ASA Grade Assessment: II - A patient with                            mild systemic disease. After reviewing the risks  and benefits, the patient was deemed in                            satisfactory condition to undergo the procedure.                           - Prior to the procedure, a History and Physical                            was performed, and patient medications and                            allergies were reviewed. The patient's tolerance of    previous anesthesia was also reviewed. The risks                            and benefits of the procedure and the sedation                            options and risks were discussed with the patient.                            All questions were answered, and informed consent                            was obtained. Prior Anticoagulants: The patient has                            taken no previous anticoagulant or antiplatelet                            agents. ASA Grade Assessment: II - A patient with                            mild systemic disease. After reviewing the risks                            and benefits, the patient was deemed in                            satisfactory condition to undergo the procedure.                           After obtaining informed consent, the scope was                            passed under direct vision. Throughout the                            procedure, the patient's blood pressure, pulse, and                            oxygen saturations were monitored continuously. The  [id number]-Q180V ([id number] Olympus duodenoscope was                            introduced through the mouth, and used to inject                            contrast into and used to cannulate the bile duct.                            The ERCP was somewhat difficult due to challenging                            cannulation. Successful completion of the procedure                            was aided by performing the maneuvers documented                            (below) in this report. The patient tolerated the                            procedure well. Scope In: Scope Out: Findings:      The major papilla was normal. On initial cholangiogram the wire was       advanced towards the pancreas and we tried to reposition the       sphincterotome but the second time we put the wire in the pancreas we       elected to place one 5 Fr by 3 cm plastic stent with a 3/4  external       pigtail and a single internal flap was placed 2.5 cm into the ventral       pancreatic duct. The stent was in good position. We then were able to       cannulate the CBD next to the pancreatic duct stent which was difficult       due to a very tight short distal stricture but once the wire was in the       CBD we proceeded with a medium size biliary sphincterotomy which was       made with a Hydratome sphincterotome using ERBE electrocautery. There       was no post-sphincterotomy bleeding. We did have some biliary drainage       at that point and the lower third of the main bile duct contained a       single localized stenosis 2-2.5 mm in length. Cells for cytology were       obtained by brushing in the lower third of the main bile ductx2 brushes       were used. One 10 Fr by 4 cm covered metal stent with no external flaps       and no internal flaps was placed 3.5 cm into the common bile duct. Bile       flowed through the stent. The stent was placed downstream (towards the       lumen of the GI tract) but hopefully in far enough to provide adequate       biliary drainage which was confirmed under fluoroscopy and by watching  drainage of bile and contrast endoscopically. Impression:               - The major papilla appeared normal.                           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 This stricture                            was treated with biliary sphincterotomy.                           - One plastic stent was placed into the ventral                            pancreatic duct.                           - A sphincterotomy was performed.                           - Cells for cytology obtained in the lower third of                            the main duct.                           - One covered metal stent was  placed into the                            common bile duct. Moderate Sedation:      Not Applicable - Patient had care per Anesthesia. Recommendation:           - Clear liquid diet for 6 hours. Slowly advance if                            doing well                           - Continue present medications.                           - Await cytology results.                           - Return to GI clinic in 1 week.                           - Telephone GI clinic for pathology results in 5  days.                           - Telephone GI clinic if symptomatic PRN. Repeat                            abdominal x-ray in 1 to 2 weeks to confirm passing                            of PD stent and assessing CBD stent positioning Procedure Code(s):        --- Professional ---                           [phone], Endoscopic retrograde                            cholangiopancreatography (ERCP); with placement of                            endoscopic stent into biliary or pancreatic duct,                            including pre- and post-dilation and guide wire                            passage, when performed, including sphincterotomy,                            when performed, each stent                           [id number], 60, Endoscopic retrograde                            cholangiopancreatography (ERCP); with placement of                            endoscopic stent into biliary or pancreatic duct,                            including pre- and post-dilation and guide wire                            passage, when performed, including sphincterotomy,                            when performed, each stent Diagnosis Code(s):        --- Professional ---                           K83.1, Obstruction of bile duct                           R17, Unspecified jaundice  R93.2, Abnormal findings on diagnostic imaging of                            liver and biliary  tract CPT copyright 2019 [employer]. All rights reserved. The codes documented in this report are preliminary and upon coder review may  be revised to meet current compliance requirements. Clarene Essex, MD 11/02/2018 3:22:33 PM This report has been signed electronically. Number of Addenda: 0

## 2018-11-02 NOTE — Transfer of Care (Signed)
Immediate Anesthesia Transfer of Care Note  Patient: GILBERT MANOLIS  Procedure(s) Performed: ENDOSCOPIC RETROGRADE CHOLANGIOPANCREATOGRAPHY (ERCP) (N/A ) FULL UPPER ENDOSCOPIC ULTRASOUND (EUS) RADIAL (N/A ) SPHINCTEROTOMY PANCREATIC STENT PLACEMENT BILIARY STENT PLACEMENT (N/A ) BILIARY BRUSHING  Patient Location: PACU  Anesthesia Type:General  Level of Consciousness: awake, alert  and patient cooperative  Airway & Oxygen Therapy: Patient Spontanous Breathing and Patient connected to face mask oxygen  Post-op Assessment: Report given to RN and Post -op Vital signs reviewed and stable  Post vital signs: Reviewed and stable  Last Vitals:  Vitals Value Taken Time  BP 112/52 11/02/2018  3:20 PM  Temp 36.9 C 11/02/2018  3:20 PM  Pulse 77 11/02/2018  3:20 PM  Resp 18 11/02/2018  3:20 PM  SpO2 100 % 11/02/2018  3:20 PM    Last Pain:  Vitals:   11/02/18 1520  TempSrc: Oral  PainSc: 0-No pain         Complications: No apparent anesthesia complications

## 2018-11-02 NOTE — Anesthesia Preprocedure Evaluation (Signed)
Anesthesia Evaluation  Patient identified by MRN, date of birth, ID band Patient awake    Reviewed: Allergy & Precautions, H&P , NPO status , Patient's Chart, lab work & pertinent test results  Airway Mallampati: II  TM Distance: >3 FB Neck ROM: full    Dental no notable dental hx.    Pulmonary neg pulmonary ROS, sleep apnea ,    Pulmonary exam normal breath sounds clear to auscultation       Cardiovascular Exercise Tolerance: Good hypertension, negative cardio ROS   Rhythm:regular Rate:Normal     Neuro/Psych negative neurological ROS  negative psych ROS   GI/Hepatic negative GI ROS, Neg liver ROS,   Endo/Other  negative endocrine ROS  Renal/GU negative Renal ROS  negative genitourinary   Musculoskeletal negative musculoskeletal ROS (+) Arthritis , Osteoarthritis,    Abdominal   Peds negative pediatric ROS (+)  Hematology negative hematology ROS (+)   Anesthesia Other Findings   Reproductive/Obstetrics negative OB ROS                             Anesthesia Physical  Anesthesia Plan  ASA: II  Anesthesia Plan: General   Post-op Pain Management:    Induction: Intravenous  PONV Risk Score and Plan: 2 and Treatment may vary due to age or medical condition, Ondansetron and Midazolam  Airway Management Planned: Oral ETT  Additional Equipment:   Intra-op Plan:   Post-operative Plan: Extubation in OR  Informed Consent: I have reviewed the patients History and Physical, chart, labs and discussed the procedure including the risks, benefits and alternatives for the proposed anesthesia with the patient or authorized representative who has indicated his/her understanding and acceptance.     Dental advisory given  Plan Discussed with: CRNA  Anesthesia Plan Comments: (  )        Anesthesia Quick Evaluation

## 2018-11-02 NOTE — Discharge Instructions (Signed)
YOU HAD AN ENDOSCOPIC PROCEDURE TODAY: Refer to the procedure report and other information in the discharge instructions given to you for any specific questions about what was found during the examination. If this information does not answer your questions, please call [facility] office at [phone] to clarify.   YOU SHOULD EXPECT: Some feelings of bloating in the abdomen. Passage of more gas than usual. Walking can help get rid of the air that was put into your GI tract during the procedure and reduce the bloating. If you had a lower endoscopy (such as a colonoscopy or flexible sigmoidoscopy) you may notice spotting of blood in your stool or on the toilet paper. Some abdominal soreness may be present for a day or two, also.  DIET: Your first meal following the procedure should be a light meal and then it is ok to progress to your normal diet. A half-sandwich or bowl of soup is an example of a good first meal. Heavy or fried foods are harder to digest and may make you feel nauseous or bloated. Drink plenty of fluids but you should avoid alcoholic beverages for 24 hours. If you had a esophageal dilation, please see attached instructions for diet.   ACTIVITY: Your care partner should take you home directly after the procedure. You should plan to take it easy, moving slowly for the rest of the day. You can resume normal activity the day after the procedure however YOU SHOULD NOT DRIVE, use power tools, machinery or perform tasks that involve climbing or major physical exertion for 24 hours (because of the sedation medicines used during the test).   SYMPTOMS TO REPORT IMMEDIATELY: A gastroenterologist can be reached at any hour. Please call [phone]  for any of the following symptoms:    Following upper endoscopy (EGD, EUS, ERCP, esophageal dilation) Vomiting of blood or coffee ground material  New, significant abdominal pain  New, significant chest pain or pain under the shoulder blades  Painful or  persistently difficult swallowing  New shortness of breath  Black, tarry-looking or red, bloody stools  FOLLOW UP:  If any biopsies were taken you will be contacted by phone or by letter within the next 1-3 weeks. Call [phone]  if you have not heard about the biopsies in 3 weeks.  Please also call with any specific questions about appointments or follow up tests.   Call if question or problem otherwise clear liquids only until 8 PM and if doing well may have soft solids this evening or may slowly advance diet tomorrow and call for pathology results in 5 days and will repeat labs at that time and then decide further work-up and plans

## 2018-11-03 ENCOUNTER — Encounter (HOSPITAL_COMMUNITY): Payer: Self-pay | Admitting: Gastroenterology

## 2018-11-03 NOTE — Anesthesia Postprocedure Evaluation (Signed)
Anesthesia Post Note  Patient: Joseph Massey  Procedure(s) Performed: ENDOSCOPIC RETROGRADE CHOLANGIOPANCREATOGRAPHY (ERCP) (N/A ) FULL UPPER ENDOSCOPIC ULTRASOUND (EUS) RADIAL (N/A ) SPHINCTEROTOMY PANCREATIC STENT PLACEMENT BILIARY STENT PLACEMENT (N/A ) BILIARY BRUSHING     Patient location during evaluation: PACU Anesthesia Type: General Level of consciousness: awake and alert Pain management: pain level controlled Vital Signs Assessment: post-procedure vital signs reviewed and stable Respiratory status: spontaneous breathing, nonlabored ventilation and respiratory function stable Cardiovascular status: blood pressure returned to baseline and stable Postop Assessment: no apparent nausea or vomiting Anesthetic complications: no    Last Vitals:  Vitals:   11/02/18 1555 11/02/18 1600  BP: 115/60 123/61  Pulse: 66 64  Resp: 18 15  Temp:    SpO2: 97% 95%    Last Pain:  Vitals:   11/02/18 1600  TempSrc:   PainSc: 0-No pain   Pain Goal:                   Lynda Rainwater

## 2018-11-08 ENCOUNTER — Ambulatory Visit
Admission: RE | Admit: 2018-11-08 | Discharge: 2018-11-08 | Disposition: A | Discharge status: Home / Self Care | Payer: Medicare Other | Source: Ambulatory Visit | Attending: Gastroenterology | Admitting: Gastroenterology

## 2018-11-08 ENCOUNTER — Other Ambulatory Visit: Payer: Self-pay | Admitting: General Surgery

## 2018-11-08 ENCOUNTER — Ambulatory Visit
Admission: RE | Admit: 2018-11-08 | Discharge: 2018-11-08 | Disposition: A | Discharge status: Home / Self Care | Payer: Medicare Other | Source: Ambulatory Visit | Attending: General Surgery | Admitting: General Surgery

## 2018-11-08 ENCOUNTER — Other Ambulatory Visit: Payer: Self-pay | Admitting: Gastroenterology

## 2018-11-08 DIAGNOSIS — K831 Obstruction of bile duct: Secondary | ICD-10-CM

## 2018-11-08 DIAGNOSIS — C221 Intrahepatic bile duct carcinoma: Secondary | ICD-10-CM

## 2018-11-08 MED ORDER — IOPAMIDOL (ISOVUE-300) INJECTION 61%
75.0000 mL | Freq: Once | INTRAVENOUS | Status: AC | PRN
  Administered 2018-11-08: 75 mL via INTRAVENOUS

## 2018-11-08 NOTE — Progress Notes (Signed)
Please let patient know chest Ct is negative.

## 2018-11-15 ENCOUNTER — Other Ambulatory Visit: Payer: Self-pay | Admitting: General Surgery

## 2018-11-15 ENCOUNTER — Other Ambulatory Visit: Payer: Self-pay | Admitting: Allergy and Immunology

## 2018-11-20 ENCOUNTER — Encounter (HOSPITAL_COMMUNITY): Payer: Self-pay | Admitting: Pharmacy Technician

## 2018-11-20 ENCOUNTER — Other Ambulatory Visit: Payer: Self-pay

## 2018-11-20 ENCOUNTER — Emergency Department (HOSPITAL_COMMUNITY): Payer: Medicare Other

## 2018-11-20 ENCOUNTER — Emergency Department (HOSPITAL_COMMUNITY)
Admission: EM | Admit: 2018-11-20 | Discharge: 2018-11-20 | Disposition: A | Discharge status: Home / Self Care | Payer: Medicare Other | Attending: Emergency Medicine | Admitting: Emergency Medicine

## 2018-11-20 ENCOUNTER — Telehealth: Payer: Self-pay | Admitting: General Surgery

## 2018-11-20 DIAGNOSIS — Z79899 Other long term (current) drug therapy: Secondary | ICD-10-CM | POA: Diagnosis not present

## 2018-11-20 DIAGNOSIS — I10 Essential (primary) hypertension: Secondary | ICD-10-CM | POA: Diagnosis not present

## 2018-11-20 DIAGNOSIS — R1013 Epigastric pain: Secondary | ICD-10-CM | POA: Diagnosis not present

## 2018-11-20 DIAGNOSIS — R109 Unspecified abdominal pain: Secondary | ICD-10-CM | POA: Diagnosis present

## 2018-11-20 LAB — CBC WITH DIFFERENTIAL/PLATELET
Abs Immature Granulocytes: 0.05 10*3/uL (ref 0.00–0.07)
Basophils Absolute: 0 10*3/uL (ref 0.0–0.1)
Basophils Relative: 0 %
Eosinophils Absolute: 0.1 10*3/uL (ref 0.0–0.5)
Eosinophils Relative: 1 %
HCT: 38.5 % — ABNORMAL LOW (ref 39.0–52.0)
Hemoglobin: 12.6 g/dL — ABNORMAL LOW (ref 13.0–17.0)
Immature Granulocytes: 1 %
Lymphocytes Relative: 13 %
Lymphs Abs: 1.3 10*3/uL (ref 0.7–4.0)
MCH: 31.8 pg (ref 26.0–34.0)
MCHC: 32.7 g/dL (ref 30.0–36.0)
MCV: 97.2 fL (ref 80.0–100.0)
Monocytes Absolute: 0.8 10*3/uL (ref 0.1–1.0)
Monocytes Relative: 8 %
Neutro Abs: 7.9 10*3/uL — ABNORMAL HIGH (ref 1.7–7.7)
Neutrophils Relative %: 77 %
Platelets: 274 10*3/uL (ref 150–400)
RBC: 3.96 MIL/uL — ABNORMAL LOW (ref 4.22–5.81)
RDW: 14 % (ref 11.5–15.5)
WBC: 10.2 10*3/uL (ref 4.0–10.5)
nRBC: 0 % (ref 0.0–0.2)

## 2018-11-20 LAB — URINALYSIS, ROUTINE W REFLEX MICROSCOPIC
Bilirubin Urine: NEGATIVE
Glucose, UA: NEGATIVE mg/dL
Hgb urine dipstick: NEGATIVE
Ketones, ur: NEGATIVE mg/dL
Leukocytes,Ua: NEGATIVE
Nitrite: NEGATIVE
Protein, ur: NEGATIVE mg/dL
Specific Gravity, Urine: 1.013 (ref 1.005–1.030)
pH: 6 (ref 5.0–8.0)

## 2018-11-20 LAB — COMPREHENSIVE METABOLIC PANEL
ALT: 126 U/L — ABNORMAL HIGH (ref 0–44)
AST: 78 U/L — ABNORMAL HIGH (ref 15–41)
Albumin: 3.6 g/dL (ref 3.5–5.0)
Alkaline Phosphatase: 132 U/L — ABNORMAL HIGH (ref 38–126)
Anion gap: 11 (ref 5–15)
BUN: 18 mg/dL (ref 8–23)
CO2: 22 mmol/L (ref 22–32)
Calcium: 9.2 mg/dL (ref 8.9–10.3)
Chloride: 103 mmol/L (ref 98–111)
Creatinine, Ser: 0.78 mg/dL (ref 0.61–1.24)
GFR calc Af Amer: >60 mL/min (ref >60)
GFR calc non Af Amer: >60 mL/min (ref >60)
Glucose, Bld: 117 mg/dL — ABNORMAL HIGH (ref 70–99)
Potassium: 3.8 mmol/L (ref 3.5–5.1)
Sodium: 136 mmol/L (ref 135–145)
Total Bilirubin: 2.6 mg/dL — ABNORMAL HIGH (ref 0.3–1.2)
Total Protein: 7 g/dL (ref 6.5–8.1)

## 2018-11-20 LAB — LIPASE, BLOOD: Lipase: 48 U/L (ref 11–51)

## 2018-11-20 MED ORDER — TRAMADOL HCL 50 MG PO TABS: 50.0000 mg | ORAL_TABLET | Freq: Four times a day (QID) | ORAL | 0 refills | Status: DC | PRN

## 2018-11-20 MED ORDER — TRAMADOL HCL 50 MG PO TABS: 50.0000 mg | ORAL_TABLET | Freq: Four times a day (QID) | ORAL | 0 refills | Status: AC | PRN

## 2018-11-20 MED ORDER — IOHEXOL 300 MG/ML  SOLN
100.0000 mL | Freq: Once | INTRAMUSCULAR | Status: AC | PRN
  Administered 2018-11-20: 100 mL via INTRAVENOUS

## 2018-11-20 NOTE — ED Triage Notes (Signed)
Pt arrives POV with epigastric pain after stent placement. 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 Reports pain to feel like a spasm.

## 2018-11-20 NOTE — ED Provider Notes (Signed)
[facility] EMERGENCY DEPARTMENT Provider Note   CSN: [id number] Arrival date & time: 11/20/18  1438     History   Chief Complaint Chief Complaint  Patient presents with  . Abdominal Pain    HPI Joseph Massey is a 68 y.o. male.     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  First episode was 1 week ago.  Pain is described as a short-lived severe spasm which resolves after a few seconds, returns to a lesser extent, and episodes resolve over a few minutes.  Patient had another 1 of these which awoke him from sleep over the night last night and another one this morning.  He spoke with his GI physician who recommended ED evaluation.  He has been applying warm compresses at home which seems to help.  Patient has not had associated nausea, vomiting, diarrhea.  No chest pain, cough, shortness of breath.  He denies any fevers or chills.  His jaundice has been resolving nicely.  Patient is scheduled for a Whipple procedure in about 2 weeks.  Onset of symptoms acute.  Course is resolved.  Nothing symptoms better or worse.     Past Medical History:  Diagnosis Date  . Arthritis   . BPH (benign prostatic hyperplasia)   . ED (erectile dysfunction)   . Gout   . History of kidney stones   . Hyperlipidemia   . Hypertension   . Jaundice LAST 2 WEEKS  . Obesity   . Recurrent upper respiratory infection (URI)   . Recurrent urticaria 02/22/2018  . Sleep apnea   . UC (ulcerative colitis) [facility])     Patient Active Problem List   Diagnosis Date Noted  . Preoperative cardiovascular examination 08/20/2018  . Mixed hyperlipidemia 08/20/2018  . Family history of hypertrophic cardiomyopathy 08/20/2018  . Recurrent urticaria 02/22/2018  . Gastritis 02/22/2018  . Allergic reaction  02/22/2018  . Seasonal and perennial allergic rhinitis 02/22/2018  . Primary osteoarthritis of right hip 06/22/2017  . 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 Primary osteoarthritis of right knee 06/22/2017  . Gout 06/22/2017  . Essential hypertension 06/22/2017    Past Surgical History:  Procedure Laterality Date  . BILIARY BRUSHING  11/02/2018   Procedure: BILIARY BRUSHING;  Surgeon: Clarene Essex, MD;  Location: [facility];  Service: Endoscopy;;  . BILIARY STENT PLACEMENT N/A 11/02/2018   Procedure: BILIARY STENT PLACEMENT;  Surgeon: Clarene Essex, MD;  Location: [facility];  Service: Endoscopy;  Laterality: N/A;  . COLONOSCOPY N/A 02/15/2013   Procedure: COLONOSCOPY;  Surgeon: Garlan Fair, MD;  Location: [facility];  Service: Endoscopy;  Laterality: N/A;  . dental implants    . ERCP N/A 11/02/2018   Procedure: ENDOSCOPIC RETROGRADE CHOLANGIOPANCREATOGRAPHY (ERCP);  Surgeon: Clarene Essex, MD;  Location: [facility];  Service: Endoscopy;  Laterality: N/A;  . ESOPHAGOGASTRODUODENOSCOPY (EGD) WITH PROPOFOL N/A 11/02/2018   Procedure: ESOPHAGOGASTRODUODENOSCOPY (EGD) WITH PROPOFOL;  Surgeon: Clarene Essex, MD;  Location: [facility];  Service: Endoscopy;  Laterality: N/A;  . EUS N/A 11/02/2018   Procedure: FULL UPPER ENDOSCOPIC ULTRASOUND (EUS) RADIAL;  Surgeon: Clarene Essex, MD;  Location: [facility];  Service: Endoscopy;  Laterality: N/A;  . PANCREATIC STENT PLACEMENT  11/02/2018   Procedure: PANCREATIC STENT PLACEMENT;  Surgeon: Clarene Essex, MD;  Location: [facility];  Service: Endoscopy;;  . SINOSCOPY    .  SPHINCTEROTOMY  11/02/2018   Procedure: SPHINCTEROTOMY;  Surgeon: Clarene Essex, MD;  Location: [facility];  Service: Endoscopy;;  . TONSILLECTOMY    . TOTAL HIP ARTHROPLASTY Right 07/14/2017   Procedure: RIGHT TOTAL HIP ARTHROPLASTY ANTERIOR APPROACH;  Surgeon: Renette Butters, MD;  Location: [facility];  Service: Orthopedics;  Laterality: Right;        Home Medications     Prior to Admission medications   Medication Sig Start Date End Date Taking? Authorizing Provider  allopurinol (ZYLOPRIM) 300 MG tablet Take 300 mg by mouth daily.    [provider]  diclofenac sodium (VOLTAREN) 1 % GEL Apply 1 application topically 3 (three) times daily as needed (for pain).  06/09/17   [provider]  doxazosin (CARDURA) 1 MG tablet Take 1 mg by mouth daily.     [provider]  fluticasone (FLONASE) 50 MCG/ACT nasal spray 1 spray 1-2 times daily in each nostril as needed. Patient taking differently: Place 1 spray into both nostrils daily.  05/10/18   Bobbitt, Sedalia Muta, MD  ketoconazole (NIZORAL) 2 % shampoo Apply 1 application topically 2 (two) times a week.    [provider]  levocetirizine (XYZAL) 5 MG tablet TAKE 1 TABLET BY MOUTH  DAILY 10/26/18   Bobbitt, Sedalia Muta, MD  losartan-hydrochlorothiazide (HYZAAR) 100-12.5 MG tablet Take 1 tablet by mouth daily.    [provider]  traMADol (ULTRAM) 50 MG tablet Take 50 mg by mouth 2 (two) times daily.    [provider]  vitamin B-12 (CYANOCOBALAMIN) 500 MCG tablet Take 500 mcg by mouth daily.    [provider]    Family History Family History  Problem Relation Age of Onset  . Heart attack Father   . Allergic rhinitis Neg Hx   . Asthma Neg Hx   . Eczema Neg Hx   . Urticaria Neg Hx     Social History Social History   Tobacco Use  . Smoking status: Never Smoker  . Smokeless tobacco: Never Used  Substance Use Topics  . Alcohol use: Not Currently    Alcohol/week: 2.0 standard drinks    Types: 2 Glasses of wine per week  . Drug use: No     Allergies   Statins and Vytorin [ezetimibe-simvastatin]   Review of Systems Review of Systems  Constitutional: Negative for fever.  HENT: Negative for rhinorrhea and sore throat.   Eyes: Negative for redness.  Respiratory: Negative for cough and shortness of breath.   Cardiovascular: Negative for  chest pain.  Gastrointestinal: Positive for abdominal pain. Negative for diarrhea, nausea and vomiting.  Genitourinary: Negative for dysuria.  Musculoskeletal: Negative for myalgias.  Skin: Negative for rash.  Neurological: Negative for headaches.     Physical Exam Updated Vital Signs BP 107/86   Pulse 69   Temp 98 F (36.7 C) (Oral)   Resp 17   Ht '5\' 10"'  (1.778 m)   Wt 95.3 kg   SpO2 97%   BMI 30.15 kg/m   Physical Exam Vitals signs and nursing note reviewed.  Constitutional:      Appearance: He is well-developed.  HENT:     Head: Normocephalic and atraumatic.  Eyes:     General:        Right eye: No discharge.        Left eye: No discharge.     Conjunctiva/sclera: Conjunctivae normal.  Neck:     Musculoskeletal: Normal range of motion and neck supple.  Cardiovascular:  Rate and Rhythm: Normal rate and regular rhythm.     Heart sounds: Normal heart sounds.  Pulmonary:     Effort: Pulmonary effort is normal.     Breath sounds: Normal breath sounds.  Abdominal:     Palpations: Abdomen is soft.     Tenderness: There is no abdominal tenderness. There is no guarding or rebound. Negative signs include Murphy's sign, Rovsing's sign and McBurney's sign.     Hernia: A hernia (Reducible) is present. Hernia is present in the umbilical area.  Skin:    General: Skin is warm and dry.  Neurological:     Mental Status: He is alert.      ED Treatments / Results  Labs (all labs ordered are listed, but only abnormal results are displayed) Labs Reviewed  CBC WITH DIFFERENTIAL/PLATELET - Abnormal; Notable for the following components:      Result Value   RBC 3.96 (*)    Hemoglobin 12.6 (*)    HCT 38.5 (*)    Neutro Abs 7.9 (*)    All other components within normal limits  COMPREHENSIVE METABOLIC PANEL - Abnormal; Notable for the following components:   Glucose, Bld 117 (*)    AST 78 (*)    ALT 126 (*)    Alkaline Phosphatase 132 (*)    Total Bilirubin 2.6 (*)     All other components within normal limits  LIPASE, BLOOD  URINALYSIS, ROUTINE W REFLEX MICROSCOPIC    EKG    Radiology Ct Abdomen Pelvis W Contrast  Result Date: 11/20/2018 CLINICAL DATA:  Periumbilical pain for 1 week. History of ulcerative colitis. EXAM: CT ABDOMEN AND PELVIS WITH CONTRAST TECHNIQUE: Multidetector CT imaging of the abdomen and pelvis was performed using the standard protocol following bolus administration of intravenous contrast. CONTRAST:  125m OMNIPAQUE IOHEXOL 300 MG/ML  SOLN COMPARISON:  MRI of the abdomen 10/29/2018 FINDINGS: Lower chest: No acute abnormality. Hepatobiliary: Pneumobilia, otherwise normal attenuation of the liver. Normal appearance of the gallbladder, apart from tiny gas bubble in its counter dependent portion. Pancreas: Atrophic appearance of the pancreas. Fluid attenuation 2.6 cm mass at the pancreaticoduodenal junction thought to represent pseudo cyst, and several smaller there is diffuse dilation of the main pancreatic duct. Distal common bile duct metallic stent in place. Spleen: Normal in size without focal abnormality. Adrenals/Urinary Tract: Adrenal glands are unremarkable. Kidneys are normal, without renal calculi, focal lesion, or hydronephrosis. Bladder is unremarkable. Stomach/Bowel: Stomach is within normal limits. No evidence of appendicitis. No evidence of bowel wall thickening, distention, or inflammatory changes. Vascular/Lymphatic: No significant vascular findings are present. No enlarged abdominal or pelvic lymph nodes. Reproductive: Prostate is unremarkable. Other: Fat containing periumbilical anterior abdominal wall hernia. Musculoskeletal: Lumbosacral spine spondylosis. Osteoarthritic changes of the left hip. Post right hip arthroplasty with normal alignment. IMPRESSION: 1. The previously identified by MRI pancreatic head mass is not well seen by CT. Interval development of diffuse dilation of the main pancreatic duct. 2. Pneumobilia within  the left lobe of the liver, expected postprocedural finding, status post common bile duct stenting. Electronically Signed   By: DFidela SalisburyM.D.   On: 11/20/2018 18:58   Dg Abd 2 Views  Result Date: 11/20/2018 CLINICAL DATA:  Abdominal pain. Recent CBD stent and pancreatic stent placement on 11/02/2018. EXAM: ABDOMEN - 2 VIEW COMPARISON:  11/08/2018 and 11/02/2018 FINDINGS: CBD stent is identified and appears in location since prior studies. The plastic pancreatic stent which previously had migrated into the ascending colon is not definitely  identified and may have passed. The bowel gas pattern is unremarkable. No pneumoperitoneum. IMPRESSION: Unchanged CBD stent appearance and location since prior studies. Plastic pancreatic stent previously identified is no longer visualized. Unremarkable bowel gas pattern. Electronically Signed   By: Margarette Canada M.D.   On: 11/20/2018 16:10    Procedures Procedures (including critical care time)  Medications Ordered in ED Medications  iohexol (OMNIPAQUE) 300 MG/ML solution 100 mL (100 mLs Intravenous Contrast Given 11/20/18 1804)     Initial Impression / Assessment and Plan / ED Course  I have reviewed the triage vital signs and the nursing notes.  Pertinent labs & imaging results that were available during my care of the patient were reviewed by me and considered in my medical decision making (see chart for details).        Patient seen and examined. Work-up initiated.  Patient is currently in no pain and is comfortable.  Repeat 2 view abdomen ordered to ensure no migration of common bile duct stent and evaluate for intestinal obstruction however patient does not clinically seem to have a bowel obstruction.  He looks very well at the current time.  Vital signs reviewed and are as follows: BP 107/86   Pulse 69   Temp 98 F (36.7 C) (Oral)   Resp 17   Ht '5\' 10"'  (1.778 m)   Wt 95.3 kg   SpO2 97%   BMI 30.15 kg/m   5:59 PM I spoke with  Dr. Cristina Gong of [facility].  2 weeks ago T bili was 5.9, alk phos was 217, AST was 80, ALT was 137.  Labs today are stable to improved.  We discussed how to proceed.  We discussed that it is likely that patient will continue to have these episodes of pain.  Feel that it would be warranted to obtain CT imaging at this time to rule out other possible causes of pain.  If this is stable, patient can be discharged home with medication to control pain as needed and to follow-up with his care team.  Patient updated and agrees with plan.  CT tech at bedside.   7:28 PM CT personally reviewed.  Patient updated on results.  Stable for discharged home at this time.  He has been taking tramadol for pain but is almost out.  He will be given #15 additional tablets of tramadol.  He does not want anything different at this time.  The patient was urged to return to the Emergency Department immediately with worsening of current symptoms, worsening abdominal pain, persistent vomiting, blood noted in stools, fever, or any other concerns. The patient verbalized understanding.     Final Clinical Impressions(s) / ED Diagnoses   Final diagnoses:  Epigastric pain   Patient with paroxysmal epigastric pain episodes in setting of recently diagnosed pancreatic mass.  Patient with several severe episodes of pain over the past day.  Pain resolved in the ED and remains resolved.  Patient with improving bilirubin and stable liver enzymes.  Normal pancreas enzyme today.  Discussed with GI.  CT does not demonstrate any new or concerning findings.  Patient will be discharged home with strict return instructions, follow-up with his surgeon and GI doctor next week as planned.  ED Discharge Orders         Ordered    traMADol (ULTRAM) 50 MG tablet  Every 6 hours PRN,   Status:  Discontinued     11/20/18 1925    traMADol (ULTRAM) 50 MG tablet  Every  6 hours PRN     11/20/18 1925           Carlisle Cater, Hershal Coria 11/20/18 1930     Gareth Morgan, MD 11/22/18 1517

## 2018-11-20 NOTE — Telephone Encounter (Signed)
Pt called this am concerned about intermittent cramping pain in his upper abd waking him up from sleep.  Denies nausea or vomiting.  No fevers.  No jaundice.  Pt has a biliary stent in place.  Given no other symptoms, we will monitor this for now.  He will notify Dr Perley Jain office as well.  Rosario Adie, MD  [facility]

## 2018-11-20 NOTE — Discharge Instructions (Signed)
Please read and follow all provided instructions.  Your diagnoses today include:  1. Epigastric pain     Tests performed today include:  Blood counts and electrolytes  Blood tests to check liver and kidney function - stable/improving liver function test, improving bilirubin  Blood tests to check pancreas function - normal  Urine test to look for infection  CT abdomen - does not show any new or concerning changes with your pancreatic mass  Vital signs. See below for your results today.   Medications prescribed:   Tramadol - narcotic-like pain medication  DO NOT drive or perform any activities that require you to be awake and alert because this medicine can make you drowsy.   Take any prescribed medications only as directed.  Home care instructions:   Follow any educational materials contained in this packet.  Follow-up instructions: Please follow-up with your gastroenterologist and surgeon next week for recheck.  Return instructions:  SEEK IMMEDIATE MEDICAL ATTENTION IF:  The pain does not go away or becomes severe   A temperature above 101F develops   Repeated vomiting occurs (multiple episodes)   The pain becomes localized to portions of the abdomen. The right side could possibly be appendicitis. In an adult, the left lower portion of the abdomen could be colitis or diverticulitis.   Blood is being passed in stools or vomit (bright red or black tarry stools)   You develop chest pain, difficulty breathing, dizziness or fainting, or become confused, poorly responsive, or inconsolable (young children)  If you have any other emergent concerns regarding your health  Additional Information: Abdominal (belly) pain can be caused by many things. Your caregiver performed an examination and possibly ordered blood/urine tests and imaging (CT scan, x-rays, ultrasound). Many cases can be observed and treated at home after initial evaluation in the emergency department. Even  though you are being discharged home, abdominal pain can be unpredictable. Therefore, you need a repeated exam if your pain does not resolve, returns, or worsens. Most patients with abdominal pain don't have to be admitted to the hospital or have surgery, but serious problems like appendicitis and gallbladder attacks can start out as nonspecific pain. Many abdominal conditions cannot be diagnosed in one visit, so follow-up evaluations are very important.  Your vital signs today were: BP 107/86    Pulse 69    Temp 98 F (36.7 C) (Oral)    Resp 17    Ht 5\' 10"  (1.778 m)    Wt 95.3 kg    SpO2 97%    BMI 30.15 kg/m  If your blood pressure (bp) was elevated above 135/85 this visit, please have this repeated by your doctor within one month. --------------

## 2018-11-20 NOTE — ED Notes (Signed)
Patient Alert and oriented to baseline. Stable and ambulatory to baseline. Patient verbalized understanding of the discharge instructions.  Patient belongings were taken by the patient.   

## 2018-11-30 NOTE — Progress Notes (Signed)
[id number] - [place], [address] [place] [address] Phone: [phone] Fax: [phone]  [place], [place] [address] [place] [address] Phone: [phone] Fax: [phone]      Your procedure is scheduled on July 9  Report to [facility] Main Entrance "A" at 0530 A.M., and check in at the Admitting office.  Call this number if you have problems the morning of surgery:  [phone]  Call [phone] if you have any questions prior to your surgery date Monday-Friday 8am-4pm    Remember:  Do not eat after midnight.  You may drink clear liquids until 0530 am the morning of surgery   Clear liquids allowed are: Water, Non-Citrus Juices (without pulp), Carbonated Beverages, Clear Tea, Black Coffee Only, and Gatorade  Please complete your PRE-SURGERY ENSURE that was provided to you by 0430 am the morning of surgery.  Please, if able, drink it in one setting. DO NOT SIP.     Take these medicines the morning of surgery with A SIP OF WATER  allopurinol (ZYLOPRIM) doxazosin (CARDURA) fluticasone (FLONASE) traMADol (ULTRAM) if needed  7 days prior to surgery STOP taking any Aspirin (unless otherwise instructed by your surgeon), Aleve, Naproxen, Ibuprofen, Motrin, Advil, Goody's, BC's, all herbal medications, fish oil, and all vitamins.    The Morning of Surgery  Do not wear jewelry, make-up or nail polish.  Do not wear lotions, powders, or perfumes/colognes, or deodorant  Men may shave face and neck.  Do not bring valuables to the hospital.  [facility] is not responsible for any belongings or valuables.  If you are a smoker, DO NOT Smoke 24 hours prior to surgery IF you wear a CPAP at night please bring your mask, tubing, and machine the morning of surgery   Remember that you must have someone to transport you home after your surgery, and remain with you for 24 hours  if you are discharged the same day.   Contacts, glasses, hearing aids, dentures or bridgework may not be worn into surgery.    Leave your suitcase in the car.  After surgery it may be brought to your room.  For patients admitted to the hospital, discharge time will be determined by your treatment team.  Patients discharged the day of surgery will not be allowed to drive home.    Special instructions:   [facility]- Preparing For Surgery  Before surgery, you can play an important role. Because skin is not sterile, your skin needs to be as free of germs as possible. You can reduce the number of germs on your skin by washing with CHG (chlorahexidine gluconate) Soap before surgery.  CHG is an antiseptic cleaner which kills germs and bonds with the skin to continue killing germs even after washing.    Oral Hygiene is also important to reduce your risk of infection.  Remember - BRUSH YOUR TEETH THE MORNING OF SURGERY WITH YOUR REGULAR TOOTHPASTE  Please do not use if you have an allergy to CHG or antibacterial soaps. If your skin becomes reddened/irritated stop using the CHG.  Do not shave (including legs and underarms) for at least 48 hours prior to first CHG shower. It is OK to shave your face.  Please follow these instructions carefully.   1. Shower the NIGHT BEFORE SURGERY and the MORNING OF SURGERY with CHG Soap.   2. If you chose to wash your hair, wash  your hair first as usual with your normal shampoo.  3. After you shampoo, rinse your hair and body thoroughly to remove the shampoo.  4. Use CHG as you would any other liquid soap. You can apply CHG directly to the skin and wash gently with a scrungie or a clean washcloth.   5. Apply the CHG Soap to your body ONLY FROM THE NECK DOWN.  Do not use on open wounds or open sores. Avoid contact with your eyes, ears, mouth and genitals (private parts). Wash Face and genitals (private parts)  with your normal soap.   6. Wash thoroughly,  paying special attention to the area where your surgery will be performed.  7. Thoroughly rinse your body with warm water from the neck down.  8. DO NOT shower/wash with your normal soap after using and rinsing off the CHG Soap.  9. Pat yourself dry with a CLEAN TOWEL.  10. Wear CLEAN PAJAMAS to bed the night before surgery, wear comfortable clothes the morning of surgery  11. Place CLEAN SHEETS on your bed the night of your first shower and DO NOT SLEEP WITH PETS.    Day of Surgery:  Do not apply any deodorants/lotions.  Please wear clean clothes to the hospital/surgery center.   Remember to brush your teeth WITH YOUR REGULAR TOOTHPASTE.   Please read over the following fact sheets that you were given.

## 2018-12-01 ENCOUNTER — Other Ambulatory Visit: Payer: Self-pay

## 2018-12-01 ENCOUNTER — Encounter (HOSPITAL_COMMUNITY): Payer: Self-pay

## 2018-12-01 ENCOUNTER — Encounter (HOSPITAL_COMMUNITY)
Admission: RE | Admit: 2018-12-01 | Discharge: 2018-12-01 | Disposition: A | Discharge status: Home / Self Care | Payer: Medicare Other | Source: Ambulatory Visit | Attending: General Surgery | Admitting: General Surgery

## 2018-12-01 DIAGNOSIS — G4733 Obstructive sleep apnea (adult) (pediatric): Secondary | ICD-10-CM | POA: Insufficient documentation

## 2018-12-01 DIAGNOSIS — Z79899 Other long term (current) drug therapy: Secondary | ICD-10-CM | POA: Diagnosis not present

## 2018-12-01 DIAGNOSIS — C221 Intrahepatic bile duct carcinoma: Secondary | ICD-10-CM | POA: Diagnosis not present

## 2018-12-01 DIAGNOSIS — Z87442 Personal history of urinary calculi: Secondary | ICD-10-CM | POA: Insufficient documentation

## 2018-12-01 DIAGNOSIS — Z01818 Encounter for other preprocedural examination: Secondary | ICD-10-CM | POA: Diagnosis not present

## 2018-12-01 DIAGNOSIS — K5190 Ulcerative colitis, unspecified, without complications: Secondary | ICD-10-CM | POA: Diagnosis not present

## 2018-12-01 DIAGNOSIS — E785 Hyperlipidemia, unspecified: Secondary | ICD-10-CM | POA: Insufficient documentation

## 2018-12-01 DIAGNOSIS — Z96641 Presence of right artificial hip joint: Secondary | ICD-10-CM | POA: Insufficient documentation

## 2018-12-01 DIAGNOSIS — N400 Enlarged prostate without lower urinary tract symptoms: Secondary | ICD-10-CM | POA: Insufficient documentation

## 2018-12-01 DIAGNOSIS — I10 Essential (primary) hypertension: Secondary | ICD-10-CM | POA: Diagnosis not present

## 2018-12-01 HISTORY — DX: Umbilical hernia without obstruction or gangrene: K42.9

## 2018-12-01 HISTORY — DX: Malignant (primary) neoplasm, unspecified: C80.1

## 2018-12-01 LAB — CBC WITH DIFFERENTIAL/PLATELET
Abs Immature Granulocytes: 0.02 10*3/uL (ref 0.00–0.07)
Basophils Absolute: 0 10*3/uL (ref 0.0–0.1)
Basophils Relative: 1 %
Eosinophils Absolute: 0.1 10*3/uL (ref 0.0–0.5)
Eosinophils Relative: 2 %
HCT: 35.6 % — ABNORMAL LOW (ref 39.0–52.0)
Hemoglobin: 12 g/dL — ABNORMAL LOW (ref 13.0–17.0)
Immature Granulocytes: 0 %
Lymphocytes Relative: 36 %
Lymphs Abs: 2.1 10*3/uL (ref 0.7–4.0)
MCH: 32.6 pg (ref 26.0–34.0)
MCHC: 33.7 g/dL (ref 30.0–36.0)
MCV: 96.7 fL (ref 80.0–100.0)
Monocytes Absolute: 0.4 10*3/uL (ref 0.1–1.0)
Monocytes Relative: 7 %
Neutro Abs: 3.3 10*3/uL (ref 1.7–7.7)
Neutrophils Relative %: 54 %
Platelets: 267 10*3/uL (ref 150–400)
RBC: 3.68 MIL/uL — ABNORMAL LOW (ref 4.22–5.81)
RDW: 14 % (ref 11.5–15.5)
WBC: 6 10*3/uL (ref 4.0–10.5)
nRBC: 0 % (ref 0.0–0.2)

## 2018-12-01 LAB — COMPREHENSIVE METABOLIC PANEL
ALT: 69 U/L — ABNORMAL HIGH (ref 0–44)
AST: 44 U/L — ABNORMAL HIGH (ref 15–41)
Albumin: 3.6 g/dL (ref 3.5–5.0)
Alkaline Phosphatase: 101 U/L (ref 38–126)
Anion gap: 11 (ref 5–15)
BUN: 16 mg/dL (ref 8–23)
CO2: 20 mmol/L — ABNORMAL LOW (ref 22–32)
Calcium: 9.2 mg/dL (ref 8.9–10.3)
Chloride: 105 mmol/L (ref 98–111)
Creatinine, Ser: 0.7 mg/dL (ref 0.61–1.24)
GFR calc Af Amer: >60 mL/min (ref >60)
GFR calc non Af Amer: >60 mL/min (ref >60)
Glucose, Bld: 119 mg/dL — ABNORMAL HIGH (ref 70–99)
Potassium: 3.6 mmol/L (ref 3.5–5.1)
Sodium: 136 mmol/L (ref 135–145)
Total Bilirubin: 1.6 mg/dL — ABNORMAL HIGH (ref 0.3–1.2)
Total Protein: 6.9 g/dL (ref 6.5–8.1)

## 2018-12-01 LAB — PROTIME-INR
INR: 1 (ref 0.8–1.2)
Prothrombin Time: 13.4 seconds (ref 11.4–15.2)

## 2018-12-01 LAB — PREPARE RBC (CROSSMATCH)

## 2018-12-01 LAB — ABO/RH: ABO/RH(D): O POS

## 2018-12-01 MED ORDER — CHLORHEXIDINE GLUCONATE CLOTH 2 % EX PADS: 6.0000 | MEDICATED_PAD | Freq: Once | CUTANEOUS | Status: DC

## 2018-12-01 NOTE — Progress Notes (Signed)
PCP - Merita Norton with [facility] - clearance by  Floyce Stakes with [facility]  Chest x-ray - na EKG - 6/20 Stress Test -  ECHO - na Cardiac Cath - na  Sleep Study - yes CPAP - yes  Aspirin Instructions:stop  Anesthesia review: ekg review   Patient denies shortness of breath, fever, cough and chest pain at PAT appointment   Patient verbalized understanding of instructions that were given to them at the PAT appointment. Patient was also instructed that they will need to review over the PAT instructions again at home before surgery.

## 2018-12-02 NOTE — Progress Notes (Signed)
Anesthesia Chart Review:  Case: [id number] Date/Time: 12/14/2018 0715   Procedures:      LAPAROSCOPY DIAGNOSTIC (N/A ) - EPIDURAL     WHIPPLE PROCEDURE WITH POSSIBLE UMBILICAL HERNIA REPAIR (N/A )   Anesthesia type: General   Pre-op diagnosis: CHOLANGIOCARCINOMA   Location: [facility] OR ROOM 02 / [place] [facility]   Surgeon: Stark Klein, MD      DISCUSSION: Patient is a 68 year old male scheduled for the above procedure. DX: cholangiocarcinoma.  Other history includes never smoker, HTN, HLD, OSA (CPAP), BPH, ulcerative colitis. Family history of hypertrophic cardiomyopathy (not evidence of CM on patient's 2018 echo). S/p right THA 07/14/17 (with spinal anesthesia).  Patient was seen urgently on 10/28/18 by [facility] for painless jaundice with weight loss. 10/29/18 MRI/MRA showed possible pancreatic mass. 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 Cytology showed atypical cells, suspicious for carcinoma. He was referred to Dr. Barry Dienes.    He is for presurgical COVID test on 12/06/18. If negative and otherwise no acute changes then I would anticipate that he can proceed as planned.    VS: BP 111/61   Pulse 80   Temp 36.9 C (Oral)   Resp 18   Ht 5\' 10"  (1.778 m)   Wt 94 kg   SpO2 100%   BMI 29.75 kg/m     PROVIDERS: Vernie Shanks, MD is PCP [facility]) - He was evaluated 08/18/18 by Jeri Lager, NP with [facility] for preoperative evaluation) for right shoulder surgery that was scheduled for 09/14/18. He was cleared for that surgery at "low risk", although I do not believe he ever had the surgery. PRN cardiology follow-up recommended. Known first degree AV block. Previously he had seen Ezzard Standing, MD in 2018. [facility] is GI    LABS: Preoperative labs noted. AST 44, ALT 69,  total bilirubin 1.6 down from 11/20/18. H/H 12.0/35.6, PLT 267. Glucose 119. PT/INR WNL. (all labs ordered are listed, but only abnormal results are displayed)  Labs Reviewed  CBC WITH DIFFERENTIAL/PLATELET - Abnormal; Notable for the following components:      Result Value   RBC 3.68 (*)    Hemoglobin 12.0 (*)    HCT 35.6 (*)    All other components within normal limits  COMPREHENSIVE METABOLIC PANEL - Abnormal; Notable for the following components:   CO2 20 (*)    Glucose, Bld 119 (*)    AST 44 (*)    ALT 69 (*)    Total Bilirubin 1.6 (*)    All other components within normal limits  PROTIME-INR  PREPARE RBC (CROSSMATCH)  TYPE AND SCREEN  ABO/RH     IMAGES: CT abd/pelvis 11/20/18: IMPRESSION: 1. The previously identified by MRI pancreatic head mass is not well seen by CT. Interval development of diffuse dilation of the main pancreatic duct. 2. Pneumobilia within the left lobe of the liver, expected postprocedural finding, status post common bile duct stenting.  CT chest 11/08/18: IMPRESSION: No acute abnormality is noted in the chest. Mild coronary artery calcifications are noted. Hepatic steatosis is noted. Aortic Atherosclerosis (ICD10-I70.0).   EKG: EKG 11/20/18: Sinus rhythm Borderline right axis deviation Anteroseptal infarct, old No significant change since last tracing Confirmed by Gareth Morgan [phone]) on 11/20/2018 4:41:54 PM   CV: Echo 05/22/17 [facility], scanned under Media tab,  Correspondence 07/14/17): Conclusions:  1.  Mild concentric LVH with normal global wall motion.  Estimated EF 65%. 2.  Mild (Grade I) mitral regurgitation. 3.  Trace tricuspid regurgitation. 4.  IVC is dilated with respiratory variation   Past Medical History:  Diagnosis Date  . Arthritis   . BPH (benign prostatic hyperplasia)   . Cancer ([place])   . ED (erectile dysfunction)   . Gout   . History of kidney stones   . Hyperlipidemia   . Hypertension   .  Jaundice LAST 2 WEEKS  . Obesity   . Recurrent upper respiratory infection (URI)   . Recurrent urticaria 02/22/2018  . Sleep apnea    cpap  . UC (ulcerative colitis) ([place])   . Umbilical hernia     Past Surgical History:  Procedure Laterality Date  . BILIARY BRUSHING  11/02/2018   Procedure: BILIARY BRUSHING;  Surgeon: Clarene Essex, MD;  Location: [facility];  Service: Endoscopy;;  . BILIARY STENT PLACEMENT N/A 11/02/2018   Procedure: BILIARY STENT PLACEMENT;  Surgeon: Clarene Essex, MD;  Location: [facility];  Service: Endoscopy;  Laterality: N/A;  . COLONOSCOPY N/A 02/15/2013   Procedure: COLONOSCOPY;  Surgeon: Garlan Fair, MD;  Location: [facility];  Service: Endoscopy;  Laterality: N/A;  . dental implants    . ERCP N/A 11/02/2018   Procedure: ENDOSCOPIC RETROGRADE CHOLANGIOPANCREATOGRAPHY (ERCP);  Surgeon: Clarene Essex, MD;  Location: [facility];  Service: Endoscopy;  Laterality: N/A;  . ESOPHAGOGASTRODUODENOSCOPY (EGD) WITH PROPOFOL N/A 11/02/2018   Procedure: ESOPHAGOGASTRODUODENOSCOPY (EGD) WITH PROPOFOL;  Surgeon: Clarene Essex, MD;  Location: [facility];  Service: Endoscopy;  Laterality: N/A;  . EUS N/A 11/02/2018   Procedure: FULL UPPER ENDOSCOPIC ULTRASOUND (EUS) RADIAL;  Surgeon: Clarene Essex, MD;  Location: [facility];  Service: Endoscopy;  Laterality: N/A;  . PANCREATIC STENT PLACEMENT  11/02/2018   Procedure: PANCREATIC STENT PLACEMENT;  Surgeon: Clarene Essex, MD;  Location: [facility];  Service: Endoscopy;;  . SINOSCOPY    . SPHINCTEROTOMY  11/02/2018   Procedure: SPHINCTEROTOMY;  Surgeon: Clarene Essex, MD;  Location: [facility];  Service: Endoscopy;;  . TONSILLECTOMY    . TOTAL HIP ARTHROPLASTY Right 07/14/2017   Procedure: RIGHT TOTAL HIP ARTHROPLASTY ANTERIOR APPROACH;  Surgeon: Renette Butters, MD;  Location: [facility];  Service: Orthopedics;  Laterality: Right;    MEDICATIONS: . allopurinol (ZYLOPRIM) 300 MG tablet  . diclofenac sodium (VOLTAREN) 1 % GEL  .  doxazosin (CARDURA) 1 MG tablet  . fluticasone (FLONASE) 50 MCG/ACT nasal spray  . ketoconazole (NIZORAL) 2 % shampoo  . levocetirizine (XYZAL) 5 MG tablet  . losartan-hydrochlorothiazide (HYZAAR) 100-12.5 MG tablet  . traMADol (ULTRAM) 50 MG tablet  . vitamin B-12 (CYANOCOBALAMIN) 500 MCG tablet   No current facility-administered medications for this encounter.     Myra Gianotti, PA-C Surgical Short Stay/Anesthesiology [facility] Phone [phone] [facility] Phone [phone] 12/02/2018 1:44 PM

## 2018-12-02 NOTE — Anesthesia Preprocedure Evaluation (Deleted)
Anesthesia Evaluation    Airway        Dental   Pulmonary           Cardiovascular hypertension,      Neuro/Psych    GI/Hepatic   Endo/Other    Renal/GU      Musculoskeletal   Abdominal   Peds  Hematology   Anesthesia Other Findings   Reproductive/Obstetrics                                                              Anesthesia Evaluation  Patient identified by MRN, date of birth, ID band Patient awake    Reviewed: Allergy & Precautions, H&P , NPO status , Patient's Chart, lab work & pertinent test results  Airway Mallampati: II  TM Distance: >3 FB Neck ROM: full    Dental no notable dental hx.    Pulmonary neg pulmonary ROS, sleep apnea ,    Pulmonary exam normal breath sounds clear to auscultation       Cardiovascular Exercise Tolerance: Good hypertension, negative cardio ROS   Rhythm:regular Rate:Normal     Neuro/Psych negative neurological ROS  negative psych ROS   GI/Hepatic negative GI ROS, Neg liver ROS,   Endo/Other  negative endocrine ROS  Renal/GU negative Renal ROS  negative genitourinary   Musculoskeletal negative musculoskeletal ROS (+) Arthritis , Osteoarthritis,    Abdominal   Peds negative pediatric ROS (+)  Hematology negative hematology ROS (+)   Anesthesia Other Findings   Reproductive/Obstetrics negative OB ROS                             Anesthesia Physical  Anesthesia Plan  ASA: II  Anesthesia Plan: General   Post-op Pain Management:    Induction: Intravenous  PONV Risk Score and Plan: 2 and Treatment may vary due to age or medical condition, Ondansetron and Midazolam  Airway Management Planned: Oral ETT  Additional Equipment:   Intra-op Plan:   Post-operative Plan: Extubation in OR  Informed Consent: I have reviewed the patients History and Physical, chart, labs and discussed the  procedure including the risks, benefits and alternatives for the proposed anesthesia with the patient or authorized representative who has indicated his/her understanding and acceptance.     Dental advisory given  Plan Discussed with: CRNA  Anesthesia Plan Comments: (  )        Anesthesia Quick Evaluation  Anesthesia Physical Anesthesia Plan  ASA:   Anesthesia Plan:    Post-op Pain Management:    Induction:   PONV Risk Score and Plan:   Airway Management Planned:   Additional Equipment:   Intra-op Plan:   Post-operative Plan:   Informed Consent:   Plan Discussed with:   Anesthesia Plan Comments:        Anesthesia Quick Evaluation

## 2018-12-06 ENCOUNTER — Other Ambulatory Visit (HOSPITAL_COMMUNITY)
Admission: RE | Admit: 2018-12-06 | Discharge: 2018-12-06 | Disposition: A | Discharge status: Home / Self Care | Payer: Medicare Other | Source: Ambulatory Visit | Attending: General Surgery | Admitting: General Surgery

## 2018-12-06 DIAGNOSIS — Z01812 Encounter for preprocedural laboratory examination: Secondary | ICD-10-CM | POA: Diagnosis present

## 2018-12-06 DIAGNOSIS — Z1159 Encounter for screening for other viral diseases: Secondary | ICD-10-CM | POA: Insufficient documentation

## 2018-12-07 ENCOUNTER — Encounter: Payer: Self-pay | Admitting: General Surgery

## 2018-12-07 LAB — SARS CORONAVIRUS 2 (TAT 6-24 HRS): SARS Coronavirus 2: NEGATIVE

## 2018-12-07 NOTE — H&P (Signed)
Joseph Massey Location: [facility] Patient #: [id number] DOB: 01-Jan-1951 Married / Language: English / Race: White Male   History of Present Illness The patient is a 68 year old male who presents with a pancreatic mass. Pt is a 68 yo M who presented to PCP wtih jaundice. He and his wife were on a walk and she noted that he was jaundiced. She is a retired Nurse, children's. Labs indicated elevated liver function tests. Imaging showed dilated intra-and extrahepatic biliary dilatation. He was also under work-up to get his right shoulder replaced. This was put on hold. Retrospectively, he has been noted that he has lost some weight. Some of this was due to circumstances. 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 He has been out of work due to his shoulder for several months and has been eating more healthy foods with his wife. He has lost between 20 and 30 pounds, but much of this has been due to the above circumstances. There have been times where he has not had quite as large of an appetite as he did previously. He denies diabetes or steatorrhea. He does not have any abdominal pain.  His mother had breast cancer and his brother had melanoma. He has no personal history of cancer. He does have a history of ulcerative colitis. He states that this was diagnosed around age 38 and continued through college and for several years after that. It seemed to resolve on its own. He is not on any immunosuppressants. He has been up-to-date with colonoscopies and has not had any colonic dysplasia.   RUQ u/s 10/27/2018  IMPRESSION: Cholelithiasis with intra and extrahepatic biliary duct dilatation. No common duct stone evident although common bile duct in the head of the pancreas is obscured by bowel gas. MRCP or ERCP may prove helpful to further evaluate as clinically warranted.  MRCP 10/29/2018 IMPRESSION: 1. Nonvisualization of a segment of the  mid to distal common bile duct over length of approximately 1.7 cm, with associated proximal intra and extrahepatic ductal dilatation and ill-defined area of differential increased enhancement in the adjacent pancreatic head and pancreaticoduodenal groove. The possibility of a pancreatic mass should be considered. Alternatively, these findings could simply be inflammatory if there is evidence for acute pancreatitis. Further evaluation with endoscopy for endoscopic ultrasound is suggested to exclude malignancy. No definite choledocholithiasis. 2. Mild hepatic steatosis.  ERCP 11/02/2018 [place] - The major papilla appeared normal. - A single localized biliary stricture was found in the lower third of the main bile duct. The stricture was probably malignant. This stricture was treated with biliary sphincterotomy. - One plastic stent was placed into the ventral pancreatic duct. - A sphincterotomy was performed. - Cells for cytology obtained in the lower third of the main duct. - One covered metal stent was placed into the common bile duct.  EUS 11/02/2018 [place] Congested duodenal mucosa. - Non-bleeding duodenal ulcer with no stigmata of bleeding, and associated post-bulbar duodenal stricture; EUS scope unable to reach second portion of duodenum. - Hyperechoic material consistent with sludge was visualized endosonographically in the common bile duct and in the gallbladder. - There was dilation in the common hepatic duct which measured up to 10 mm. - Multiple cystic lesions were seen in the pancreatic head. - There was no other sign of significant pathology in the pancreatic head, pancreatic body and pancreatic tail. - Overall constellation of findings most suggestive of either macrocystic lesion (mucinous cystadenoma or  IPMN) which has potentially degenerated into malignancy versus cholangiocarcinoma versus CBD stricture from [facility] versus uncinate pancreatic lesion (unable to visualize uncinate  pancreas due to post-bulbar stricture).  cytology ERCP 11/02/2018 Diagnosis BILE DUCT BRUSHING(SPECIMEN 1 OF 1 COLLECTED 11/02/18): ATYPICAL CELLS PRESENT, SUSPICIOUS FOR CARCINOMA.   Past Surgical History Colon Polyp Removal - Colonoscopy  Hip Surgery  Right. Oral Surgery  Tonsillectomy   Diagnostic Studies History Colonoscopy  within last year  Allergies Statins  Allergies Reconciled   Medication History traMADol HCl (50MG  Tablet, Oral) Active. Allopurinol (300MG  Tablet, Oral) Active. Doxazosin Mesylate (1MG  Tablet, Oral) Active. Ketoconazole (2% Shampoo, External) Active. Losartan Potassium-HCTZ (100-12.5MG  Tablet, Oral) Active. Xyzal (5MG  Tablet, Oral) Active. Medications Reconciled  Social History  Alcohol use  Moderate alcohol use. Caffeine use  Coffee. Illicit drug use  Remotely quit drug use. Tobacco use  Never smoker.  Family History Alcohol Abuse  Brother. Arthritis  Father. Breast Cancer  Mother. Colon Polyps  Mother. Diabetes Mellitus  Father. Heart Disease  Father. Heart disease in male family member before age 54  Melanoma  Brother. Respiratory Condition  Father. Thyroid problems  Mother.  Other Problems Arthritis  Diverticulosis  Gastroesophageal Reflux Disease  High blood pressure  Hypercholesterolemia  Sleep Apnea  Ulcerative Colitis  Umbilical Hernia Repair     Review of Systems General Present- Fatigue and Weight Loss. Not Present- Appetite Loss, Chills, Fever, Night Sweats and Weight Gain. Skin Present- Jaundice. Not Present- Change in Wart/Mole, Dryness, Hives, New Lesions, Non-Healing Wounds, Rash and Ulcer. HEENT Present- Seasonal Allergies and Wears glasses/contact lenses. Not Present- Earache, Hearing Loss, Hoarseness, Nose Bleed, Oral Ulcers, Ringing in the Ears, Sinus Pain, Sore Throat, Visual Disturbances and Yellow Eyes. Gastrointestinal Not Present- Abdominal Pain, Bloating, Bloody Stool,  Change in Bowel Habits, Chronic diarrhea, Constipation, Difficulty Swallowing, Excessive gas, Gets full quickly at meals, Hemorrhoids, Indigestion, Nausea, Rectal Pain and Vomiting. Male Genitourinary Not Present- Blood in Urine, Change in Urinary Stream, Frequency, Impotence, Nocturia, Painful Urination, Urgency and Urine Leakage. Musculoskeletal Present- Joint Pain. Not Present- Back Pain, Joint Stiffness, Muscle Pain, Muscle Weakness and Swelling of Extremities. Neurological Present- Numbness. Not Present- Decreased Memory, Fainting, Headaches, Seizures, Tingling, Tremor, Trouble walking and Weakness. Psychiatric Not Present- Anxiety, Bipolar, Change in Sleep Pattern, Depression, Fearful and Frequent crying. Endocrine Not Present- Cold Intolerance, Excessive Hunger, Hair Changes, Heat Intolerance, Hot flashes and New Diabetes. Hematology Not Present- Blood Thinners, Easy Bruising, Excessive bleeding, Gland problems, HIV and Persistent Infections.  Vitals  Weight: 212.6 lb Height: 71in Body Surface Area: 2.16 m Body Mass Index: 29.65 kg/m  Temp.: 98.47F  Pulse: 85 (Regular)  BP: 128/74(Sitting, Left Arm, Standard)       Physical Exam General Mental Status-Alert. General Appearance-Consistent with stated age. Hydration-Well hydrated. Voice-Normal.  Head and Neck Head-normocephalic, atraumatic with no lesions or palpable masses. Trachea-midline. Thyroid Gland Characteristics - normal size and consistency.  Eye Eyeball - Bilateral-Extraocular movements intact. Sclera/Conjunctiva - Bilateral-Sclera icteric.  Chest and Lung Exam Chest and lung exam reveals -quiet, even and easy respiratory effort with no use of accessory muscles and on auscultation, normal breath sounds, no adventitious sounds and normal vocal resonance. Inspection Chest Wall - Normal. Back - normal.  Cardiovascular Cardiovascular examination reveals -normal heart sounds,  regular rate and rhythm with no murmurs and normal pedal pulses bilaterally.  Abdomen Inspection Inspection of the abdomen reveals - Note: large umbilical hernia. Palpation/Percussion Palpation and Percussion of the abdomen reveal - Soft, Non Tender, No Rebound tenderness, No Rigidity (  guarding) and No hepatosplenomegaly. Auscultation Auscultation of the abdomen reveals - Bowel sounds normal.  Neurologic Neurologic evaluation reveals -alert and oriented x 3 with no impairment of recent or remote memory. Mental Status-Normal.  Musculoskeletal Global Assessment -Note: no gross deformities.  Normal Exam - Left-Upper Extremity Strength Normal and Lower Extremity Strength Normal. Normal Exam - Right-Upper Extremity Strength Normal and Lower Extremity Strength Normal.  Lymphatic Head & Neck  General Head & Neck Lymphatics: Bilateral - Description - Normal. Axillary  General Axillary Region: Bilateral - Description - Normal. Tenderness - Non Tender. Femoral & Inguinal  Generalized Femoral & Inguinal Lymphatics: Bilateral - Description - No Generalized lymphadenopathy.    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 This is most likely cholangiocarcinoma as there is a malignant appearing stricture and bile duct brushings were atypical suspicious for adenocarcinoma. There was no consistent appearance of a solid pancreatic mass. Either way, it needs to be resected. This will require a Whipple. He has labs set up for monday. I will send orders as well for CA 19-9. I suspect this is already set up.  We will get chest CT as well to look for metastatic disease.  I discussed the surgery with the patient including diagrams of anatomy. I discussed the potential for diagnostic laparoscopy. In the case of pancreatic cancer, if spread of the disease is found, we will abort the procedure and not proceed with  resection. The rationale for this was discussed with the patient. There has not been data to support resection of Stage IV disease in terms of survival benefit.  We discussed possible complications including: Potential of aborting procedure if tumor is invading the superior mesenteric or hepatic arteries Bleeding Infection and possible wound complications such as hernia Damage to adjacent structures Leak of anastamoses, primarily pancreatic Possible need for other procedures Possible prolonged nausea with possible need for external feeding. Possible prolonged hospital stay. Possible development of diabetes or worsening of current diabetes. Possible pancreatic exocrine insufficiency Prolonged fatigue/weakness/appetite Possible early recurrence of cancer   The patient understands and wishes to proceed. The patient has been advised to turn in disability paperwork to our office.  Current Plans You are being scheduled for surgery- Our schedulers will call you.  You should hear from our office's scheduling department within 5 working days about the location, date, and time of surgery. We try to make accommodations for patient's preferences in scheduling surgery, but sometimes the OR schedule or the surgeon's schedule prevents [place] from making those accommodations.  If you have not heard from our office [phone]) in 5 working days, call the office and ask for your surgeon's nurse.  If you have other questions about your diagnosis, plan, or surgery, call the office and ask for your surgeon's nurse.  Pt Education - [facility] whipple pt info CT CHEST W CON (53976) (cholangiocarcinoma. 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 ? IPMN. Possibly atypical appearance of cancer.   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    Signed by Stark Klein, MD

## 2018-12-08 NOTE — Anesthesia Preprocedure Evaluation (Signed)
Anesthesia Evaluation  Patient identified by MRN, date of birth, ID band Patient awake    Reviewed: Allergy & Precautions, H&P , NPO status , Patient's Chart, lab work & pertinent test results  Airway Mallampati: II  TM Distance: >3 FB Neck ROM: full    Dental  (+) Dental Advisory Given   Pulmonary neg pulmonary ROS, sleep apnea ,    Pulmonary exam normal breath sounds clear to auscultation       Cardiovascular Exercise Tolerance: Good hypertension, negative cardio ROS   Rhythm:regular Rate:Normal     Neuro/Psych negative neurological ROS  negative psych ROS   GI/Hepatic Neg liver ROS, PUD,   Endo/Other  negative endocrine ROS  Renal/GU negative Renal ROS     Musculoskeletal negative musculoskeletal ROS (+) Arthritis , Osteoarthritis,    Abdominal   Peds  Hematology negative hematology ROS (+)   Anesthesia Other Findings   Reproductive/Obstetrics negative OB ROS                             Anesthesia Physical  Anesthesia Plan  ASA: III  Anesthesia Plan: General and Epidural   Post-op Pain Management:  Regional for Post-op pain   Induction: Intravenous  PONV Risk Score and Plan: 2 and Treatment may vary due to age or medical condition, Ondansetron and Midazolam  Airway Management Planned: Oral ETT  Additional Equipment: Arterial line  Intra-op Plan:   Post-operative Plan: Possible Post-op intubation/ventilation  Informed Consent: I have reviewed the patients History and Physical, chart, labs and discussed the procedure including the risks, benefits and alternatives for the proposed anesthesia with the patient or authorized representative who has indicated his/her understanding and acceptance.     Dental advisory given  Plan Discussed with: CRNA  Anesthesia Plan Comments: (  2 x PIV 18g or greater)        Anesthesia Quick Evaluation

## 2018-12-09 ENCOUNTER — Encounter (HOSPITAL_COMMUNITY): Admission: RE | Disposition: E | Payer: Self-pay | Source: Home / Self Care | Attending: General Surgery

## 2018-12-09 ENCOUNTER — Inpatient Hospital Stay (HOSPITAL_COMMUNITY): Payer: Medicare Other | Admitting: Certified Registered Nurse Anesthetist

## 2018-12-09 ENCOUNTER — Encounter (HOSPITAL_COMMUNITY): Payer: Self-pay

## 2018-12-09 ENCOUNTER — Other Ambulatory Visit: Payer: Self-pay

## 2018-12-09 ENCOUNTER — Inpatient Hospital Stay (HOSPITAL_COMMUNITY)
Admission: RE | Admit: 2018-12-09 | Discharge: 2019-01-01 | DRG: 405 | Disposition: E | Payer: Medicare Other | Attending: General Surgery | Admitting: General Surgery

## 2018-12-09 ENCOUNTER — Inpatient Hospital Stay (HOSPITAL_COMMUNITY): Payer: Medicare Other | Admitting: Vascular Surgery

## 2018-12-09 DIAGNOSIS — I469 Cardiac arrest, cause unspecified: Secondary | ICD-10-CM | POA: Diagnosis not present

## 2018-12-09 DIAGNOSIS — C221 Intrahepatic bile duct carcinoma: Secondary | ICD-10-CM | POA: Diagnosis present

## 2018-12-09 DIAGNOSIS — K8689 Other specified diseases of pancreas: Secondary | ICD-10-CM | POA: Diagnosis not present

## 2018-12-09 DIAGNOSIS — K3184 Gastroparesis: Secondary | ICD-10-CM | POA: Diagnosis not present

## 2018-12-09 DIAGNOSIS — C250 Malignant neoplasm of head of pancreas: Secondary | ICD-10-CM | POA: Diagnosis present

## 2018-12-09 DIAGNOSIS — E876 Hypokalemia: Secondary | ICD-10-CM | POA: Diagnosis not present

## 2018-12-09 DIAGNOSIS — M109 Gout, unspecified: Secondary | ICD-10-CM | POA: Diagnosis present

## 2018-12-09 DIAGNOSIS — G4730 Sleep apnea, unspecified: Secondary | ICD-10-CM | POA: Diagnosis present

## 2018-12-09 DIAGNOSIS — J3801 Paralysis of vocal cords and larynx, unilateral: Secondary | ICD-10-CM | POA: Diagnosis not present

## 2018-12-09 DIAGNOSIS — N401 Enlarged prostate with lower urinary tract symptoms: Secondary | ICD-10-CM | POA: Diagnosis present

## 2018-12-09 DIAGNOSIS — M19011 Primary osteoarthritis, right shoulder: Secondary | ICD-10-CM | POA: Diagnosis present

## 2018-12-09 DIAGNOSIS — K429 Umbilical hernia without obstruction or gangrene: Secondary | ICD-10-CM | POA: Diagnosis present

## 2018-12-09 DIAGNOSIS — Z808 Family history of malignant neoplasm of other organs or systems: Secondary | ICD-10-CM | POA: Diagnosis not present

## 2018-12-09 DIAGNOSIS — M1611 Unilateral primary osteoarthritis, right hip: Secondary | ICD-10-CM | POA: Diagnosis present

## 2018-12-09 DIAGNOSIS — E785 Hyperlipidemia, unspecified: Secondary | ICD-10-CM | POA: Diagnosis present

## 2018-12-09 DIAGNOSIS — T17908A Unspecified foreign body in respiratory tract, part unspecified causing other injury, initial encounter: Secondary | ICD-10-CM

## 2018-12-09 DIAGNOSIS — Z8371 Family history of colonic polyps: Secondary | ICD-10-CM

## 2018-12-09 DIAGNOSIS — I10 Essential (primary) hypertension: Secondary | ICD-10-CM | POA: Diagnosis present

## 2018-12-09 DIAGNOSIS — R338 Other retention of urine: Secondary | ICD-10-CM | POA: Diagnosis present

## 2018-12-09 DIAGNOSIS — K219 Gastro-esophageal reflux disease without esophagitis: Secondary | ICD-10-CM | POA: Diagnosis present

## 2018-12-09 DIAGNOSIS — Z885 Allergy status to narcotic agent status: Secondary | ICD-10-CM

## 2018-12-09 DIAGNOSIS — Z9089 Acquired absence of other organs: Secondary | ICD-10-CM

## 2018-12-09 DIAGNOSIS — M1711 Unilateral primary osteoarthritis, right knee: Secondary | ICD-10-CM | POA: Diagnosis present

## 2018-12-09 DIAGNOSIS — Z86010 Personal history of colonic polyps: Secondary | ICD-10-CM

## 2018-12-09 DIAGNOSIS — C259 Malignant neoplasm of pancreas, unspecified: Secondary | ICD-10-CM | POA: Diagnosis present

## 2018-12-09 DIAGNOSIS — K831 Obstruction of bile duct: Secondary | ICD-10-CM | POA: Diagnosis present

## 2018-12-09 DIAGNOSIS — Z803 Family history of malignant neoplasm of breast: Secondary | ICD-10-CM | POA: Diagnosis not present

## 2018-12-09 DIAGNOSIS — J302 Other seasonal allergic rhinitis: Secondary | ICD-10-CM | POA: Diagnosis present

## 2018-12-09 DIAGNOSIS — Z888 Allergy status to other drugs, medicaments and biological substances status: Secondary | ICD-10-CM

## 2018-12-09 HISTORY — PX: WHIPPLE PROCEDURE: SHX2667

## 2018-12-09 HISTORY — PX: LAPAROSCOPY: SHX197

## 2018-12-09 LAB — PREPARE RBC (CROSSMATCH)

## 2018-12-09 LAB — CBC
HCT: 27.1 % — ABNORMAL LOW (ref 39.0–52.0)
Hemoglobin: 9.2 g/dL — ABNORMAL LOW (ref 13.0–17.0)
MCH: 32.4 pg (ref 26.0–34.0)
MCHC: 33.9 g/dL (ref 30.0–36.0)
MCV: 95.4 fL (ref 80.0–100.0)
Platelets: 215 10*3/uL (ref 150–400)
RBC: 2.84 MIL/uL — ABNORMAL LOW (ref 4.22–5.81)
RDW: 13.7 % (ref 11.5–15.5)
WBC: 7.7 10*3/uL (ref 4.0–10.5)
nRBC: 0 % (ref 0.0–0.2)

## 2018-12-09 LAB — GLUCOSE, CAPILLARY
Glucose-Capillary: 156 mg/dL — ABNORMAL HIGH (ref 70–99)
Glucose-Capillary: 164 mg/dL — ABNORMAL HIGH (ref 70–99)

## 2018-12-09 LAB — POCT I-STAT 7, (LYTES, BLD GAS, ICA,H+H)
Acid-base deficit: 3 mmol/L — ABNORMAL HIGH (ref 0.0–2.0)
Acid-base deficit: 3 mmol/L — ABNORMAL HIGH (ref 0.0–2.0)
Bicarbonate: 22.3 mmol/L (ref 20.0–28.0)
Bicarbonate: 22.7 mmol/L (ref 20.0–28.0)
Calcium, Ion: 1.23 mmol/L (ref 1.15–1.40)
Calcium, Ion: 1.2 mmol/L (ref 1.15–1.40)
HCT: 29 % — ABNORMAL LOW (ref 39.0–52.0)
HCT: 28 % — ABNORMAL LOW (ref 39.0–52.0)
Hemoglobin: 9.9 g/dL — ABNORMAL LOW (ref 13.0–17.0)
Hemoglobin: 9.5 g/dL — ABNORMAL LOW (ref 13.0–17.0)
O2 Saturation: 99 %
O2 Saturation: 100 %
Patient temperature: 35.2
Patient temperature: 35.8
Potassium: 3.3 mmol/L — ABNORMAL LOW (ref 3.5–5.1)
Potassium: 3.3 mmol/L — ABNORMAL LOW (ref 3.5–5.1)
Sodium: 139 mmol/L (ref 135–145)
Sodium: 139 mmol/L (ref 135–145)
TCO2: 24 mmol/L (ref 22–32)
TCO2: 24 mmol/L (ref 22–32)
pCO2 arterial: 37.2 mmHg (ref 32.0–48.0)
pCO2 arterial: 41.3 mmHg (ref 32.0–48.0)
pH, Arterial: 7.378 (ref 7.350–7.450)
pH, Arterial: 7.342 — ABNORMAL LOW (ref 7.350–7.450)
pO2, Arterial: 139 mmHg — ABNORMAL HIGH (ref 83.0–108.0)
pO2, Arterial: 184 mmHg — ABNORMAL HIGH (ref 83.0–108.0)

## 2018-12-09 LAB — MRSA PCR SCREENING: MRSA by PCR: NEGATIVE

## 2018-12-09 SURGERY — LAPAROSCOPY, DIAGNOSTIC
Anesthesia: General | Site: Abdomen

## 2018-12-09 MED ORDER — NALOXONE HCL 0.4 MG/ML IJ SOLN: 0.4000 mg | INTRAMUSCULAR | Status: DC | PRN

## 2018-12-09 MED ORDER — ONDANSETRON HCL 4 MG/2ML IJ SOLN: 4.0000 mg | Freq: Four times a day (QID) | INTRAMUSCULAR | Status: DC | PRN

## 2018-12-09 MED ORDER — PHENYLEPHRINE 40 MCG/ML (10ML) SYRINGE FOR IV PUSH (FOR BLOOD PRESSURE SUPPORT)
PREFILLED_SYRINGE | INTRAVENOUS | Status: AC
  Filled 2018-12-09: qty 20

## 2018-12-09 MED ORDER — KETAMINE HCL 10 MG/ML IJ SOLN
INTRAMUSCULAR | Status: DC | PRN
  Administered 2018-12-09: 10 mg via INTRAVENOUS
  Administered 2018-12-09: 50 mg via INTRAVENOUS
  Administered 2018-12-09 (×4): 10 mg via INTRAVENOUS

## 2018-12-09 MED ORDER — SUCCINYLCHOLINE CHLORIDE 200 MG/10ML IV SOSY
PREFILLED_SYRINGE | INTRAVENOUS | Status: AC
  Filled 2018-12-09: qty 10

## 2018-12-09 MED ORDER — WATER FOR IRRIGATION, STERILE IR SOLN
Status: DC | PRN
  Administered 2018-12-09: 1000 mL via SURGICAL_CAVITY

## 2018-12-09 MED ORDER — ACETAMINOPHEN 325 MG PO TABS: 650.0000 mg | ORAL_TABLET | Freq: Four times a day (QID) | ORAL | Status: DC | PRN

## 2018-12-09 MED ORDER — LIDOCAINE HCL 1 % IJ SOLN
INTRAMUSCULAR | Status: DC | PRN
  Administered 2018-12-09: 3 mL

## 2018-12-09 MED ORDER — ONDANSETRON 4 MG PO TBDP: 4.0000 mg | ORAL_TABLET | Freq: Four times a day (QID) | ORAL | Status: DC | PRN

## 2018-12-09 MED ORDER — DEXAMETHASONE SODIUM PHOSPHATE 10 MG/ML IJ SOLN
INTRAMUSCULAR | Status: AC
  Filled 2018-12-09: qty 1

## 2018-12-09 MED ORDER — ROCURONIUM BROMIDE 10 MG/ML (PF) SYRINGE
PREFILLED_SYRINGE | INTRAVENOUS | Status: AC
  Filled 2018-12-09: qty 20

## 2018-12-09 MED ORDER — LIDOCAINE IN D5W 4-5 MG/ML-% IV SOLN
1.0000 mg/min | INTRAVENOUS | Status: DC
  Filled 2018-12-09: qty 500

## 2018-12-09 MED ORDER — LACTATED RINGERS IV SOLN
INTRAVENOUS | Status: DC | PRN
  Administered 2018-12-09: 07:00:00 via INTRAVENOUS

## 2018-12-09 MED ORDER — CEFAZOLIN SODIUM-DEXTROSE 2-4 GM/100ML-% IV SOLN
INTRAVENOUS | Status: AC
  Filled 2018-12-09: qty 100

## 2018-12-09 MED ORDER — ACETAMINOPHEN 10 MG/ML IV SOLN
1000.0000 mg | Freq: Four times a day (QID) | INTRAVENOUS | Status: AC
  Administered 2018-12-09 – 2018-12-10 (×4): 1000 mg via INTRAVENOUS
  Filled 2018-12-09 (×4): qty 100

## 2018-12-09 MED ORDER — LIDOCAINE 2% (20 MG/ML) 5 ML SYRINGE
INTRAMUSCULAR | Status: AC
  Filled 2018-12-09: qty 5

## 2018-12-09 MED ORDER — CEFAZOLIN SODIUM-DEXTROSE 2-4 GM/100ML-% IV SOLN
2.0000 g | Freq: Three times a day (TID) | INTRAVENOUS | Status: AC
  Administered 2018-12-09: 21:00:00 2 g via INTRAVENOUS
  Filled 2018-12-09: qty 100

## 2018-12-09 MED ORDER — DIPHENHYDRAMINE HCL 50 MG/ML IJ SOLN: 12.5000 mg | Freq: Four times a day (QID) | INTRAMUSCULAR | Status: DC | PRN

## 2018-12-09 MED ORDER — SODIUM CHLORIDE 0.9 % IV SOLN
10.0000 mL/h | Freq: Once | INTRAVENOUS | Status: AC
  Administered 2018-12-09: 18:00:00 10 mL/h via INTRAVENOUS

## 2018-12-09 MED ORDER — FENTANYL CITRATE (PF) 250 MCG/5ML IJ SOLN
INTRAMUSCULAR | Status: DC | PRN
  Administered 2018-12-09 (×5): 50 ug via INTRAVENOUS

## 2018-12-09 MED ORDER — HYDROMORPHONE 1 MG/ML IV SOLN
INTRAVENOUS | Status: DC
  Administered 2018-12-09: 30 mg via INTRAVENOUS
  Administered 2018-12-09: 0 mg via INTRAVENOUS
  Administered 2018-12-10: 0.4 mg via INTRAVENOUS
  Administered 2018-12-10: 0 mg via INTRAVENOUS
  Administered 2018-12-10 (×3): 0.4 mg via INTRAVENOUS
  Administered 2018-12-11: 0.2 mg via INTRAVENOUS
  Administered 2018-12-11: 0.4 mg via INTRAVENOUS
  Administered 2018-12-11: 1 mg via INTRAVENOUS
  Administered 2018-12-11: 0 mg via INTRAVENOUS
  Administered 2018-12-11: 0.2 mg via INTRAVENOUS
  Administered 2018-12-12: 0.4 mg via INTRAVENOUS
  Administered 2018-12-12: 0 mg via INTRAVENOUS
  Administered 2018-12-12: 0.2 mg via INTRAVENOUS
  Filled 2018-12-09: qty 30

## 2018-12-09 MED ORDER — BUPIVACAINE-EPINEPHRINE (PF) 0.25% -1:200000 IJ SOLN
INTRAMUSCULAR | Status: AC
  Filled 2018-12-09: qty 30

## 2018-12-09 MED ORDER — DIPHENHYDRAMINE HCL 12.5 MG/5ML PO ELIX: 12.5000 mg | ORAL_SOLUTION | Freq: Four times a day (QID) | ORAL | Status: DC | PRN

## 2018-12-09 MED ORDER — CEFAZOLIN SODIUM 1 G IJ SOLR
INTRAMUSCULAR | Status: AC
  Filled 2018-12-09: qty 20

## 2018-12-09 MED ORDER — SCOPOLAMINE 1 MG/3DAYS TD PT72
1.0000 | MEDICATED_PATCH | TRANSDERMAL | Status: DC
  Administered 2018-12-09: 06:00:00 1.5 mg via TRANSDERMAL

## 2018-12-09 MED ORDER — LIDOCAINE IN D5W 4-5 MG/ML-% IV SOLN
INTRAVENOUS | Status: DC | PRN
  Administered 2018-12-09: 25 ug/kg/min via INTRAVENOUS

## 2018-12-09 MED ORDER — ORAL CARE MOUTH RINSE
15.0000 mL | Freq: Two times a day (BID) | OROMUCOSAL | Status: DC
  Administered 2018-12-09 – 2018-12-17 (×10): 15 mL via OROMUCOSAL

## 2018-12-09 MED ORDER — PHENOL 1.4 % MT LIQD
1.0000 | OROMUCOSAL | Status: DC | PRN
  Administered 2018-12-09: 21:00:00 1 via OROMUCOSAL
  Filled 2018-12-09: qty 177

## 2018-12-09 MED ORDER — ROPIVACAINE HCL 2 MG/ML IJ SOLN
8.0000 mL/h | INTRAMUSCULAR | Status: DC
  Administered 2018-12-09: 16:00:00 6 mL/h via EPIDURAL
  Administered 2018-12-10 – 2018-12-16 (×7): 8 mL/h via EPIDURAL
  Filled 2018-12-09 (×11): qty 200

## 2018-12-09 MED ORDER — PROPOFOL 500 MG/50ML IV EMUL
INTRAVENOUS | Status: AC
  Filled 2018-12-09: qty 50

## 2018-12-09 MED ORDER — PROPOFOL 10 MG/ML IV BOLUS
INTRAVENOUS | Status: AC
  Filled 2018-12-09: qty 40

## 2018-12-09 MED ORDER — SCOPOLAMINE 1 MG/3DAYS TD PT72
MEDICATED_PATCH | TRANSDERMAL | Status: AC
  Administered 2018-12-09: 1.5 mg via TRANSDERMAL
  Filled 2018-12-09: qty 1

## 2018-12-09 MED ORDER — CHLORHEXIDINE GLUCONATE CLOTH 2 % EX PADS
6.0000 | MEDICATED_PAD | Freq: Every day | CUTANEOUS | Status: DC
  Administered 2018-12-09 – 2018-12-17 (×6): 6 via TOPICAL

## 2018-12-09 MED ORDER — 0.9 % SODIUM CHLORIDE (POUR BTL) OPTIME
TOPICAL | Status: DC | PRN
  Administered 2018-12-09 (×2): 1000 mL

## 2018-12-09 MED ORDER — SODIUM CHLORIDE 0.9 % IV SOLN
INTRAVENOUS | Status: DC | PRN
  Administered 2018-12-09: 25 ug/min via INTRAVENOUS
  Administered 2018-12-09: 100 ug/min via INTRAVENOUS

## 2018-12-09 MED ORDER — ROCURONIUM BROMIDE 10 MG/ML (PF) SYRINGE
PREFILLED_SYRINGE | INTRAVENOUS | Status: DC | PRN
  Administered 2018-12-09 (×2): 20 mg via INTRAVENOUS
  Administered 2018-12-09: 60 mg via INTRAVENOUS
  Administered 2018-12-09 (×3): 20 mg via INTRAVENOUS

## 2018-12-09 MED ORDER — ACETAMINOPHEN 500 MG PO TABS
1000.0000 mg | ORAL_TABLET | ORAL | Status: AC
  Administered 2018-12-09: 06:00:00 1000 mg via ORAL

## 2018-12-09 MED ORDER — SODIUM CHLORIDE 0.9% FLUSH: 9.0000 mL | INTRAVENOUS | Status: DC | PRN

## 2018-12-09 MED ORDER — ROPIVACAINE HCL 2 MG/ML IJ SOLN
INTRAMUSCULAR | Status: DC | PRN
  Administered 2018-12-09: 3 mL via EPIDURAL

## 2018-12-09 MED ORDER — CHLORHEXIDINE GLUCONATE 0.12 % MT SOLN
15.0000 mL | Freq: Two times a day (BID) | OROMUCOSAL | Status: DC
  Administered 2018-12-09 – 2018-12-17 (×12): 15 mL via OROMUCOSAL
  Filled 2018-12-09 (×12): qty 15

## 2018-12-09 MED ORDER — EPHEDRINE 5 MG/ML INJ
INTRAVENOUS | Status: AC
  Filled 2018-12-09: qty 10

## 2018-12-09 MED ORDER — ONDANSETRON HCL 4 MG/2ML IJ SOLN
INTRAMUSCULAR | Status: AC
  Filled 2018-12-09: qty 2

## 2018-12-09 MED ORDER — PHENYLEPHRINE 40 MCG/ML (10ML) SYRINGE FOR IV PUSH (FOR BLOOD PRESSURE SUPPORT)
PREFILLED_SYRINGE | INTRAVENOUS | Status: DC | PRN
  Administered 2018-12-09: 120 ug via INTRAVENOUS
  Administered 2018-12-09: 80 ug via INTRAVENOUS
  Administered 2018-12-09: 40 ug via INTRAVENOUS
  Administered 2018-12-09 (×2): 80 ug via INTRAVENOUS
  Administered 2018-12-09: 120 ug via INTRAVENOUS

## 2018-12-09 MED ORDER — DEXAMETHASONE SODIUM PHOSPHATE 10 MG/ML IJ SOLN
INTRAMUSCULAR | Status: DC | PRN
  Administered 2018-12-09: 10 mg via INTRAVENOUS

## 2018-12-09 MED ORDER — SUGAMMADEX SODIUM 200 MG/2ML IV SOLN
INTRAVENOUS | Status: DC | PRN
  Administered 2018-12-09: 188 mg via INTRAVENOUS

## 2018-12-09 MED ORDER — PANTOPRAZOLE SODIUM 40 MG IV SOLR
40.0000 mg | Freq: Every day | INTRAVENOUS | Status: DC
  Administered 2018-12-09 – 2018-12-12 (×4): 40 mg via INTRAVENOUS
  Filled 2018-12-09 (×4): qty 40

## 2018-12-09 MED ORDER — ACETAMINOPHEN 500 MG PO TABS
ORAL_TABLET | ORAL | Status: AC
  Administered 2018-12-09: 06:00:00 1000 mg via ORAL
  Filled 2018-12-09: qty 2

## 2018-12-09 MED ORDER — METOPROLOL TARTRATE 5 MG/5ML IV SOLN: 5.0000 mg | Freq: Four times a day (QID) | INTRAVENOUS | Status: DC | PRN

## 2018-12-09 MED ORDER — METHOCARBAMOL 1000 MG/10ML IJ SOLN
500.0000 mg | Freq: Three times a day (TID) | INTRAVENOUS | Status: DC | PRN
  Filled 2018-12-09: qty 5

## 2018-12-09 MED ORDER — ALBUMIN HUMAN 5 % IV SOLN
12.5000 g | Freq: Once | INTRAVENOUS | Status: AC
  Administered 2018-12-09: 12.5 g via INTRAVENOUS

## 2018-12-09 MED ORDER — SUCCINYLCHOLINE CHLORIDE 20 MG/ML IJ SOLN
INTRAMUSCULAR | Status: DC | PRN
  Administered 2018-12-09: 140 mg via INTRAVENOUS

## 2018-12-09 MED ORDER — DIPHENHYDRAMINE HCL 50 MG/ML IJ SOLN
INTRAMUSCULAR | Status: AC
  Filled 2018-12-09: qty 1

## 2018-12-09 MED ORDER — SODIUM CHLORIDE (PF) 0.9 % IJ SOLN
INTRAMUSCULAR | Status: AC
  Filled 2018-12-09: qty 10

## 2018-12-09 MED ORDER — INSULIN ASPART 100 UNIT/ML ~~LOC~~ SOLN
0.0000 [IU] | SUBCUTANEOUS | Status: DC
  Administered 2018-12-09 (×2): 2 [IU] via SUBCUTANEOUS
  Administered 2018-12-10 – 2018-12-13 (×11): 1 [IU] via SUBCUTANEOUS

## 2018-12-09 MED ORDER — FENTANYL CITRATE (PF) 250 MCG/5ML IJ SOLN
INTRAMUSCULAR | Status: AC
  Filled 2018-12-09: qty 5

## 2018-12-09 MED ORDER — DICLOFENAC SODIUM 1 % TD GEL: 1.0000 "application " | Freq: Three times a day (TID) | TRANSDERMAL | Status: DC | PRN

## 2018-12-09 MED ORDER — ALBUMIN HUMAN 5 % IV SOLN
INTRAVENOUS | Status: AC
  Filled 2018-12-09: qty 250

## 2018-12-09 MED ORDER — KETAMINE HCL 50 MG/5ML IJ SOSY
PREFILLED_SYRINGE | INTRAMUSCULAR | Status: AC
  Filled 2018-12-09: qty 10

## 2018-12-09 MED ORDER — GABAPENTIN 300 MG PO CAPS
300.0000 mg | ORAL_CAPSULE | ORAL | Status: AC
  Administered 2018-12-09: 06:00:00 300 mg via ORAL

## 2018-12-09 MED ORDER — PROPOFOL 10 MG/ML IV BOLUS
INTRAVENOUS | Status: DC | PRN
  Administered 2018-12-09: 120 mg via INTRAVENOUS
  Administered 2018-12-09: 20 mg via INTRAVENOUS

## 2018-12-09 MED ORDER — KETOCONAZOLE 2 % EX SHAM
1.0000 "application " | MEDICATED_SHAMPOO | CUTANEOUS | Status: DC
  Filled 2018-12-09 (×2): qty 120

## 2018-12-09 MED ORDER — CEFAZOLIN SODIUM-DEXTROSE 2-4 GM/100ML-% IV SOLN
2.0000 g | INTRAVENOUS | Status: AC
  Administered 2018-12-09 (×2): 2 g via INTRAVENOUS

## 2018-12-09 MED ORDER — ACETAMINOPHEN 10 MG/ML IV SOLN
INTRAVENOUS | Status: AC
  Filled 2018-12-09: qty 100

## 2018-12-09 MED ORDER — ALBUMIN HUMAN 5 % IV SOLN
INTRAVENOUS | Status: DC | PRN
  Administered 2018-12-09 (×2): via INTRAVENOUS

## 2018-12-09 MED ORDER — EPHEDRINE SULFATE-NACL 50-0.9 MG/10ML-% IV SOSY
PREFILLED_SYRINGE | INTRAVENOUS | Status: DC | PRN
  Administered 2018-12-09: 10 mg via INTRAVENOUS
  Administered 2018-12-09: 5 mg via INTRAVENOUS
  Administered 2018-12-09: 10 mg via INTRAVENOUS
  Administered 2018-12-09: 5 mg via INTRAVENOUS
  Administered 2018-12-09 (×2): 10 mg via INTRAVENOUS

## 2018-12-09 MED ORDER — LACTATED RINGERS IV SOLN
INTRAVENOUS | Status: DC | PRN
  Administered 2018-12-09 (×3): via INTRAVENOUS

## 2018-12-09 MED ORDER — GABAPENTIN 300 MG PO CAPS
ORAL_CAPSULE | ORAL | Status: AC
  Filled 2018-12-09: qty 1

## 2018-12-09 MED ORDER — DEXMEDETOMIDINE HCL IN NACL 200 MCG/50ML IV SOLN
INTRAVENOUS | Status: DC | PRN
  Administered 2018-12-09 (×3): 12 ug via INTRAVENOUS
  Administered 2018-12-09 (×2): 8 ug via INTRAVENOUS

## 2018-12-09 MED ORDER — MIDAZOLAM HCL 2 MG/2ML IJ SOLN
INTRAMUSCULAR | Status: AC
  Filled 2018-12-09: qty 2

## 2018-12-09 MED ORDER — ONDANSETRON HCL 4 MG/2ML IJ SOLN
4.0000 mg | Freq: Four times a day (QID) | INTRAMUSCULAR | Status: DC | PRN
  Administered 2018-12-10 – 2018-12-17 (×11): 4 mg via INTRAVENOUS
  Filled 2018-12-09 (×11): qty 2

## 2018-12-09 MED ORDER — LIDOCAINE 2% (20 MG/ML) 5 ML SYRINGE
INTRAMUSCULAR | Status: DC | PRN
  Administered 2018-12-09: 100 mg via INTRAVENOUS

## 2018-12-09 MED ORDER — MIDAZOLAM HCL 2 MG/2ML IJ SOLN
INTRAMUSCULAR | Status: DC | PRN
  Administered 2018-12-09: 2 mg via INTRAVENOUS

## 2018-12-09 MED ORDER — KCL IN DEXTROSE-NACL 20-5-0.45 MEQ/L-%-% IV SOLN
INTRAVENOUS | Status: DC
  Administered 2018-12-09 – 2018-12-16 (×15): via INTRAVENOUS
  Filled 2018-12-09 (×17): qty 1000

## 2018-12-09 MED ORDER — PHENYLEPHRINE 40 MCG/ML (10ML) SYRINGE FOR IV PUSH (FOR BLOOD PRESSURE SUPPORT)
PREFILLED_SYRINGE | INTRAVENOUS | Status: AC
  Filled 2018-12-09: qty 10

## 2018-12-09 MED ORDER — LIDOCAINE-EPINEPHRINE (PF) 1.5 %-1:200000 IJ SOLN
INTRAMUSCULAR | Status: DC | PRN
  Administered 2018-12-09: 5 mL via PERINEURAL

## 2018-12-09 MED ORDER — ONDANSETRON HCL 4 MG/2ML IJ SOLN
INTRAMUSCULAR | Status: DC | PRN
  Administered 2018-12-09 (×2): 4 mg via INTRAVENOUS

## 2018-12-09 MED ORDER — HEMOSTATIC AGENTS (NO CHARGE) OPTIME: TOPICAL | Status: DC | PRN

## 2018-12-09 MED ORDER — LIDOCAINE HCL (PF) 1 % IJ SOLN
INTRAMUSCULAR | Status: AC
  Filled 2018-12-09: qty 30

## 2018-12-09 MED ORDER — ACETAMINOPHEN 650 MG RE SUPP: 650.0000 mg | Freq: Four times a day (QID) | RECTAL | Status: DC | PRN

## 2018-12-09 MED ORDER — PHENYLEPHRINE HCL (PRESSORS) 10 MG/ML IV SOLN: INTRAVENOUS | Status: DC | PRN

## 2018-12-09 SURGICAL SUPPLY — 102 items
BAG BILE T-TUBES STRL (MISCELLANEOUS) ×6 IMPLANT
BIOPATCH RED 1 DISK 7.0 (GAUZE/BANDAGES/DRESSINGS) ×2 IMPLANT
BIOPATCH RED 1IN DISK 7.0MM (GAUZE/BANDAGES/DRESSINGS) ×2
BLADE CLIPPER SURG (BLADE) ×2 IMPLANT
BLADE SURG 10 STRL SS (BLADE) ×3 IMPLANT
BOOT SUTURE AID YELLOW STND (SUTURE) ×5 IMPLANT
CANISTER SUCT 3000ML PPV (MISCELLANEOUS) ×3 IMPLANT
CHLORAPREP W/TINT 26 (MISCELLANEOUS) ×3 IMPLANT
CLIP VESOCCLUDE LG 6/CT (CLIP) ×3 IMPLANT
CLIP VESOCCLUDE MED 24/CT (CLIP) ×3 IMPLANT
CLIP VESOLOCK LG 6/CT PURPLE (CLIP) ×11 IMPLANT
CLIP VESOLOCK MED 6/CT (CLIP) ×3 IMPLANT
CLIP VESOLOCK MED LG 6/CT (CLIP) ×3 IMPLANT
CONT SPEC 4OZ CLIKSEAL STRL BL (MISCELLANEOUS) ×2 IMPLANT
COVER MAYO STAND STRL (DRAPES) ×3 IMPLANT
COVER SURGICAL LIGHT HANDLE (MISCELLANEOUS) ×3 IMPLANT
COVER WAND RF STERILE (DRAPES) ×3 IMPLANT
DECANTER SPIKE VIAL GLASS SM (MISCELLANEOUS) ×6 IMPLANT
DERMABOND ADVANCED (GAUZE/BANDAGES/DRESSINGS) ×2
DERMABOND ADVANCED .7 DNX12 (GAUZE/BANDAGES/DRESSINGS) ×1 IMPLANT
DRAIN CHANNEL 19F RND (DRAIN) ×6 IMPLANT
DRAPE WARM FLUID 44X44 (DRAPES) ×3 IMPLANT
DRSG COVADERM 4X14 (GAUZE/BANDAGES/DRESSINGS) ×2 IMPLANT
DRSG TEGADERM 2-3/8X2-3/4 SM (GAUZE/BANDAGES/DRESSINGS) ×2 IMPLANT
DRSG TEGADERM 4X4.75 (GAUZE/BANDAGES/DRESSINGS) ×2 IMPLANT
ELECT BLADE 4.0 EZ CLEAN MEGAD (MISCELLANEOUS) ×3
ELECT BLADE 6.5 EXT (BLADE) ×3 IMPLANT
ELECT REM PT RETURN 9FT ADLT (ELECTROSURGICAL) ×3
ELECTRODE BLDE 4.0 EZ CLN MEGD (MISCELLANEOUS) IMPLANT
ELECTRODE REM PT RTRN 9FT ADLT (ELECTROSURGICAL) ×1 IMPLANT
GAUZE SPONGE 2X2 8PLY STRL LF (GAUZE/BANDAGES/DRESSINGS) IMPLANT
GAUZE SPONGE 4X4 12PLY STRL (GAUZE/BANDAGES/DRESSINGS) ×2 IMPLANT
GEL ULTRASOUND 20GR AQUASONIC (MISCELLANEOUS) ×3 IMPLANT
GLOVE BIO SURGEON STRL SZ 6 (GLOVE) ×6 IMPLANT
GLOVE BIO SURGEON STRL SZ8 (GLOVE) ×4 IMPLANT
GLOVE BIOGEL PI IND STRL 8 (GLOVE) IMPLANT
GLOVE BIOGEL PI INDICATOR 8 (GLOVE) ×4
GLOVE ECLIPSE 6.0 STRL STRAW (GLOVE) ×4 IMPLANT
GLOVE INDICATOR 6.5 STRL GRN (GLOVE) ×6 IMPLANT
GLOVE SURG SS PI 6.0 STRL IVOR (GLOVE) ×4 IMPLANT
GOWN STRL REUS W/ TWL LRG LVL3 (GOWN DISPOSABLE) ×2 IMPLANT
GOWN STRL REUS W/ TWL XL LVL3 (GOWN DISPOSABLE) IMPLANT
GOWN STRL REUS W/TWL 2XL LVL3 (GOWN DISPOSABLE) ×6 IMPLANT
GOWN STRL REUS W/TWL LRG LVL3 (GOWN DISPOSABLE) ×14
GOWN STRL REUS W/TWL XL LVL3 (GOWN DISPOSABLE) ×4
HANDLE SUCTION POOLE (INSTRUMENTS) ×1 IMPLANT
KIT BASIN OR (CUSTOM PROCEDURE TRAY) ×3 IMPLANT
KIT MARKER MARGIN INK (KITS) ×3 IMPLANT
KIT TUBE JEJUNAL 16FR (CATHETERS) ×3 IMPLANT
KIT TURNOVER KIT B (KITS) ×3 IMPLANT
L-HOOK LAP DISP 36CM (ELECTROSURGICAL) ×3
LHOOK LAP DISP 36CM (ELECTROSURGICAL) ×1 IMPLANT
LOOP VESSEL MAXI BLUE (MISCELLANEOUS) ×3 IMPLANT
LOOP VESSEL MINI RED (MISCELLANEOUS) ×3 IMPLANT
NEEDLE 22X1 1/2 (OR ONLY) (NEEDLE) ×3 IMPLANT
NS IRRIG 1000ML POUR BTL (IV SOLUTION) ×6 IMPLANT
PAD ARMBOARD 7.5X6 YLW CONV (MISCELLANEOUS) ×4 IMPLANT
PENCIL SMOKE EVACUATOR (MISCELLANEOUS) ×3 IMPLANT
RELOAD PROXIMATE 75MM BLUE (ENDOMECHANICALS) ×9 IMPLANT
RELOAD STAPLE 75 3.8 BLU REG (ENDOMECHANICALS) IMPLANT
SET TUBE SMOKE EVAC HIGH FLOW (TUBING) ×3 IMPLANT
SHEARS FOC LG CVD HARMONIC 17C (MISCELLANEOUS) ×3 IMPLANT
SLEEVE ENDOPATH XCEL 5M (ENDOMECHANICALS) ×3 IMPLANT
SLEEVE SUCTION 125 (MISCELLANEOUS) ×3 IMPLANT
SLEEVE SUCTION CATH 165 (SLEEVE) ×3 IMPLANT
SPONGE GAUZE 2X2 STER 10/PKG (GAUZE/BANDAGES/DRESSINGS) ×2
SPONGE LAP 18X18 RF (DISPOSABLE) ×14 IMPLANT
STAPLER PROXIMATE 75MM BLUE (STAPLE) ×5 IMPLANT
STAPLER VISISTAT 35W (STAPLE) ×3 IMPLANT
SUCTION POOLE HANDLE (INSTRUMENTS) ×3
SUT 5.0 PDS RB-1 (SUTURE) ×10
SUT ETHILON 2 0 FS 18 (SUTURE) ×8 IMPLANT
SUT ETHILON 2 LR (SUTURE) IMPLANT
SUT MNCRL AB 4-0 PS2 18 (SUTURE) ×3 IMPLANT
SUT PDS AB 1 TP1 96 (SUTURE) ×7 IMPLANT
SUT PDS AB 3-0 SH 27 (SUTURE) ×12 IMPLANT
SUT PDS AB 4-0 RB1 27 (SUTURE) ×31 IMPLANT
SUT PDS PLUS AB 5-0 RB-1 (SUTURE) ×5 IMPLANT
SUT PROLENE 3 0 SH 48 (SUTURE) ×12 IMPLANT
SUT PROLENE 4 0 RB 1 (SUTURE) ×6
SUT PROLENE 4-0 RB1 .5 CRCL 36 (SUTURE) ×2 IMPLANT
SUT PROLENE 5 0 RB 1 DA (SUTURE) ×6 IMPLANT
SUT SILK 2 0 SH CR/8 (SUTURE) ×9 IMPLANT
SUT SILK 2 0 TIES 10X30 (SUTURE) ×3 IMPLANT
SUT SILK 3 0 SH CR/8 (SUTURE) ×5 IMPLANT
SUT SILK 3 0 TIES 10X30 (SUTURE) ×3 IMPLANT
SUT VIC AB 2-0 CT1 27 (SUTURE)
SUT VIC AB 2-0 CT1 TAPERPNT 27 (SUTURE) IMPLANT
SUT VIC AB 2-0 SH 18 (SUTURE) ×3 IMPLANT
SUT VIC AB 3-0 SH 18 (SUTURE) ×3 IMPLANT
SUT VIC AB 3-0 SH 27 (SUTURE) ×2
SUT VIC AB 3-0 SH 27X BRD (SUTURE) ×1 IMPLANT
SUT VICRYL AB 2 0 TIES (SUTURE) IMPLANT
TOWEL GREEN STERILE (TOWEL DISPOSABLE) ×3 IMPLANT
TOWEL GREEN STERILE FF (TOWEL DISPOSABLE) ×3 IMPLANT
TRAY FOLEY MTR SLVR 14FR STAT (SET/KITS/TRAYS/PACK) ×3 IMPLANT
TRAY LAPAROSCOPIC MC (CUSTOM PROCEDURE TRAY) ×3 IMPLANT
TROCAR XCEL NON-BLD 5MMX100MML (ENDOMECHANICALS) ×3 IMPLANT
TUBE CONNECTING 12'X1/4 (SUCTIONS) ×1
TUBE CONNECTING 12X1/4 (SUCTIONS) ×2 IMPLANT
TUBE FEEDING ENTERAL 5FR 16IN (TUBING) ×2 IMPLANT
YANKAUER SUCT BULB TIP NO VENT (SUCTIONS) ×3 IMPLANT

## 2018-12-09 NOTE — Progress Notes (Signed)
Spoke to Dr. Donne Hazel about patient's sleep apnea and he wears Bipap at night when at home. MD wants to try to hold off using bipap tonight and monitor O2 saturation. Will let patient know and continue to monitor.

## 2018-12-09 NOTE — Transfer of Care (Signed)
Immediate Anesthesia Transfer of Care Note  Patient: Joseph Massey  Procedure(s) Performed: LAPAROSCOPY DIAGNOSTIC (N/A Abdomen) WHIPPLE PROCEDURE WITH UMBILICAL HERNIA REPAIR (N/A Abdomen)  Patient Location: PACU  Anesthesia Type:General  Level of Consciousness: awake, alert  and oriented  Airway & Oxygen Therapy: Patient Spontanous Breathing and Patient connected to face mask oxygen  Post-op Assessment: Report given to RN and Post -op Vital signs reviewed and stable  Post vital signs: Reviewed and stable  Last Vitals:  Vitals Value Taken Time  BP 105/61 12/10/2018 1512  Temp    Pulse 76 12/10/2018 1520  Resp 15 12/10/2018 1520  SpO2 95 % 12/04/2018 1520  Vitals shown include unvalidated device data.  Last Pain:  Vitals:   12/19/2018 0557  TempSrc: Oral         Complications: No apparent anesthesia complications

## 2018-12-09 NOTE — Anesthesia Procedure Notes (Signed)
Arterial Line Insertion Start/End7/02/2019 7:00 AM Performed by: Alain Marion, CRNA, CRNA  Preanesthetic checklist: patient identified, IV checked, site marked, risks and benefits discussed, surgical consent, monitors and equipment checked, pre-op evaluation, timeout performed and anesthesia consent Lidocaine 1% used for infiltration and patient sedated Right, radial was placed Catheter size: 20 G Hand hygiene performed  and maximum sterile barriers used   Attempts: 1 Procedure performed without using ultrasound guided technique. Following insertion, dressing applied and Biopatch. Post procedure assessment: normal  Patient tolerated the procedure well with no immediate complications.

## 2018-12-09 NOTE — Interval H&P Note (Signed)
History and Physical Interval Note:  12/22/2018 7:16 AM  Joseph Massey  has presented today for surgery, with the diagnosis of CHOLANGIOCARCINOMA.  The various methods of treatment have been discussed with the patient and family. After consideration of risks, benefits and other options for treatment, the patient has consented to  Procedure(s) with comments: LAPAROSCOPY DIAGNOSTIC (N/A) - EPIDURAL WHIPPLE PROCEDURE WITH POSSIBLE UMBILICAL HERNIA REPAIR (N/A) as a surgical intervention.  The patient's history has been reviewed, patient examined, no change in status, stable for surgery.  I have reviewed the patient's chart and labs.  Questions were answered to the patient's satisfaction.     Stark Klein

## 2018-12-09 NOTE — Anesthesia Procedure Notes (Signed)
Procedure Name: Intubation Date/Time: 12/21/2018 7:42 AM Performed by: Alain Marion, CRNA Pre-anesthesia Checklist: Patient identified, Emergency Drugs available, Suction available and Patient being monitored Patient Re-evaluated:Patient Re-evaluated prior to induction Oxygen Delivery Method: Circle System Utilized Preoxygenation: Pre-oxygenation with 100% oxygen Induction Type: IV induction and Rapid sequence Laryngoscope Size: Miller and 2 Grade View: Grade I Tube type: Oral Tube size: 8.0 mm Number of attempts: 1 Airway Equipment and Method: Stylet Placement Confirmation: ETT inserted through vocal cords under direct vision,  positive ETCO2 and breath sounds checked- equal and bilateral Secured at: 23 cm Tube secured with: Tape Dental Injury: Teeth and Oropharynx as per pre-operative assessment

## 2018-12-09 NOTE — Op Note (Signed)
PREOPERATIVE DIAGNOSIS: distal cholangiocarcinoma, cTxN0, M0  POSTOPERATIVE DIAGNOSIS: Same.   PROCEDURES PERFORMED:  Diagnostic laparoscopy Classic pancreaticoduodenectomy   Placement of pancreatic duct stent  Jejunostomy Feeding tube Umbilical hernia repair.   SURGEON: Stark Klein, MD   ASSISTANT: Serita Grammes, MD Romana Juniper, MD Ewell Poe, RNFA   ANESTHESIA: General and epidural   FINDINGS: 5 cm periampullary mass. SOFT pancreatic tissue. 8 mm common bile duct. 2 mm pancreatic duct  SPECIMENS:  1. Pancreaticoduodenectomy with gallbladder:  2. portal nodes  3. Omentum    ESTIMATED BLOOD LOSS: 400 mL.   COMPLICATIONS: None known.   PROCEDURE:   Pt was identified in the holding area and taken to  the operating room, and placed supine on the operating room  table. General anesthesia was induced. The patient's abdomen was  prepped and draped in a sterile fashion, after a Foley catheter was  placed. A time-out was performed according to the surgical safety check  list. When all was correct we continued.   The patient was placed in reverse trendelenburg position and rotated to the right.  The left subcostal margin was anesthetized with local anesthesia.  A 5 mm optiview trocar was placed under direct visualization.  The abdomen was insufflated with carbon dioxide.  The abdomen was examined.  A second port was placed in the upper midline to be able to better visualize the right liver.  No evidence of carcinomatosis was seen.    A midline incision was made from the xiphoid to just above the umbilicus. . The subcutaneous tissues were divided with the Bovie cautery. The peritoneum was entered in the center of the abdomen. Digital retraction was then used to elevate the preperitoneal fat, and this was taken with the cautery as well.  Care was taken to protect the underlying viscera. The umbilical hernia sac was entered and the incarcerated omentum was manually reduced.     The Bookwalter self-retaining retractor was placed for visualization. The right colon was taken down off of the white line of Toldt and from the retroperitoneum at the hepatic flexure. The porta was identified. The  duodenum was kocherized extensively with blunt dissection and with cautery. This was a bit stuck and a small segment of gerota's fascia was taken off the superomedial right kidney due to what felt like an enlarged lymph node.  The gallbladder was taken off the liver with a combination of blunt dissection and cautery. The cystic duct was clipped with the Hemalock clips. The cystic duct was divided and the gallbladder was passed off. Some of the portal/common hepatic lymph nodes were enlarged and were taken with the harmonic scalpel.    The common bile duct was skeletonized above the cystic duct takeoff around 1 cm above the firm mass. A vessel loop was passed around it. The gastroduodenal artery, as well as the common hepatic artery were skeletonized. The proper hepatic artery was traced out to make sure that flow was going to both sides of the liver when the GDA was clamped. The GDA was test clamped with the bulldog, with good flow to the liver and no signs of ischemia. This was clamped and divided.  Both sides were suture ligated. The proper hepatic artery was reflected upward, and the anterior portal vein was exposed. A Kelly clamp was passed underneath the pancreas at the superior mesenteric vein, and this passed easily with no signs of tumor involvement.   Attention was then directed to the stomach, and the omentum was taken  off of the stomach at the border of the antrum and the body. The  gastrohepatic ligament was taken down with the harmonic, and care was  taken to make sure there was not a replaced left hepatic artery in this  location. The stomach was divided with the GIA-75 stapler. The border  of the stomach was oversewn with a 3-0 running PDS suture.   Attention was then  directed to the small bowel. Around 10 cm past the  ligament of Treitz was located, and this was divided with the 75-GIA.  The distal portion of the jejunum was also oversewn with a 3-0 PDS  suture. The fourth portion of the duodenum was skeletonized with the  harmonic scalpel, taking down all of the mesenteric vessels. The  ligament of Treitz was taken down. The IMV was preserved.  The duodenum was then passed underneath the portal vein.   At this point  the pancreas was divided with the cautery. 2-0 silk sutures were tied down and the inferior and superior border of the pancreas. The Bovie was used to coagulate the small bleeders at the border of the pancreas. The Overholt in combination with the harmonic and locking Weck clips  were then used to take the uncinate process off of the portal vein and  the superior mesenteric artery. There was a segment of what appeared to be a vessel crossing over the portal vein.  This was isolated, clamped, and divided.  As it was divided, it appeared to be a segment of pancreas.  Care was taken not to incorporate the  superior mesenteric artery in the dissection. The specimen was then marked and passed off the table for frozen section margin.     At this point the frozens returned back as all negative.  The jejunum was then passed underneath  the SMV in order to get appropriate lie for the pancreatic and biliary  anastomoses. The more distal portion of the jejunum was pulled up over  the colon, and two 3-0 silks were placed through the posterior border  of the stomach for the gastrojejunostomy. The stomach and the small  bowel were opened, and a GIA-75 was used to create an end-to-end  anastomosis. The open areas of the staple line were examined to ensure  that there was hemostasis. The defect was then closed with a single  layer of running Connell suture of 3-0 PDS. Prior to a complete  closure, the NG tube was passed toward the afferent limb.   Around  30 cm beyond the gastrojejunostomy was identified. A 3-0 silk pursestring suture was placed in the small intestine on the antimesenteric border. An appropriate location for a J-tube is located in the left upper abdomen. A incision was made in the tonsil used to pull the J-tube through the abdominal wall. The jejunum was opened and the tube was threaded distally in the jejunum.  Witzel sutures were placed proximally on the jejunum.  The tube was then pexed to the abdominal wall with 2-0 silk sutures.  The tube was flushed with saline.   The appropriate location for the choledochojejunostomy was identified, and  the small bowel was opened approximately 10 mm. The anastamosis was created with approximately 11 4-0 interrupted PDS sutures.   The 2 corner sutures were placed first  and then the posterior layer was done in an interrupted fashion tying on  the inside. The superior layer was then closed with interrupted sutures as  well.    The pancreatic anastomosis  was then created.  The pancreas was very soft, and the duct was 2 mm. A pediatric feeding tube was used as a pancreatic stent. The posterior layer was formed first with 2-0 silk sutures in interrupted fashion. Three 5-0 PDS sutures were used to create a duct to mucosa anastamosis.   The anterior layer was then oversewn with 2-0 silks to dunk the pancreatic parenchyma.   The abdomen were then irrigated and then the pancreatic and biliary anastomoses were covered with Vistaseal. This was allowed to dry.  The abdomen was then irrigated  again and all the laparotomy sponges were removed. A lap count was  performed, which was correct. Two 19-Blake drains were placed, with the  lateral-most drain placed behind the choledochojejunostomy. The medial  Blake drain was placed just anterior and slightly superior to the  pancreaticojejunostomy. The fascia was then closed with #1 looped running PDS sutures. The skin was irrigated and then closed with   staples. The wounds were cleaned, dried and dressed with a sterile  dressing.   The patient tolerated the procedure well and was extubated and taken to  PACU in stable condition. Needle and sponge counts were correct x2.

## 2018-12-09 NOTE — Addendum Note (Signed)
Addendum  created 12/21/2018 1805 by Nolon Nations, MD   Order list changed

## 2018-12-09 NOTE — Anesthesia Postprocedure Evaluation (Signed)
Anesthesia Post Note  Patient: Joseph Massey  Procedure(s) Performed: LAPAROSCOPY DIAGNOSTIC (N/A Abdomen) WHIPPLE PROCEDURE WITH UMBILICAL HERNIA REPAIR (N/A Abdomen)     Patient location during evaluation: PACU Anesthesia Type: General Level of consciousness: sedated and patient cooperative Pain management: pain level controlled Vital Signs Assessment: post-procedure vital signs reviewed and stable Respiratory status: spontaneous breathing Cardiovascular status: stable Anesthetic complications: no    Last Vitals:  Vitals:   12/01/2018 1630 12/14/2018 1700  BP:  97/64  Pulse: 73 73  Resp: 15 16  Temp: 37.1 C 37.6 C  SpO2: 96% 97%    Last Pain:  Vitals:   12/13/2018 1700  TempSrc: Oral  PainSc: 0-No pain                 Nolon Nations

## 2018-12-09 NOTE — Anesthesia Procedure Notes (Signed)
Epidural  Start time: 12/24/2018 7:10 AM End time: 12/08/2018 7:25 AM  Staffing Anesthesiologist: Nolon Nations, MD  Preanesthetic Checklist Completed: patient identified, surgical consent, pre-op evaluation, timeout performed, IV checked, risks and benefits discussed and monitors and equipment checked  Epidural Patient position: sitting Prep: site prepped and draped, DuraPrep and Full Sterile technique. Gown/glove, drarpe, mask Patient monitoring: heart rate, cardiac monitor, continuous pulse ox and blood pressure Approach: midline Location: thoracic (1-12) (T8-9) Injection technique: LOR air and LOR saline  Needle:  Needle type: Tuohy  Needle gauge: 17 G Needle length: 9 cm Needle insertion depth: 6 cm Catheter type: closed end flexible Catheter size: 19 Gauge Catheter at skin depth: 12 cm Test dose: negative and 1.5% lidocaine with Epi 1:200 K  Assessment Sensory level: T4 Events: blood not aspirated, injection not painful, no injection resistance, negative IV test and no paresthesia

## 2018-12-10 ENCOUNTER — Inpatient Hospital Stay (HOSPITAL_COMMUNITY): Payer: Medicare Other | Admitting: Anesthesiology

## 2018-12-10 ENCOUNTER — Encounter (HOSPITAL_COMMUNITY): Payer: Self-pay | Admitting: General Surgery

## 2018-12-10 LAB — PROTIME-INR
INR: 1.2 (ref 0.8–1.2)
Prothrombin Time: 15.5 seconds — ABNORMAL HIGH (ref 11.4–15.2)

## 2018-12-10 LAB — COMPREHENSIVE METABOLIC PANEL
ALT: 125 U/L — ABNORMAL HIGH (ref 0–44)
AST: 104 U/L — ABNORMAL HIGH (ref 15–41)
Albumin: 3 g/dL — ABNORMAL LOW (ref 3.5–5.0)
Alkaline Phosphatase: 67 U/L (ref 38–126)
Anion gap: 9 (ref 5–15)
BUN: 9 mg/dL (ref 8–23)
CO2: 21 mmol/L — ABNORMAL LOW (ref 22–32)
Calcium: 8.5 mg/dL — ABNORMAL LOW (ref 8.9–10.3)
Chloride: 108 mmol/L (ref 98–111)
Creatinine, Ser: 0.63 mg/dL (ref 0.61–1.24)
GFR calc Af Amer: >60 mL/min (ref >60)
GFR calc non Af Amer: >60 mL/min (ref >60)
Glucose, Bld: 124 mg/dL — ABNORMAL HIGH (ref 70–99)
Potassium: 3.8 mmol/L (ref 3.5–5.1)
Sodium: 138 mmol/L (ref 135–145)
Total Bilirubin: 1 mg/dL (ref 0.3–1.2)
Total Protein: 5.3 g/dL — ABNORMAL LOW (ref 6.5–8.1)

## 2018-12-10 LAB — CBC
HCT: 27.2 % — ABNORMAL LOW (ref 39.0–52.0)
Hemoglobin: 9.2 g/dL — ABNORMAL LOW (ref 13.0–17.0)
MCH: 32.4 pg (ref 26.0–34.0)
MCHC: 33.8 g/dL (ref 30.0–36.0)
MCV: 95.8 fL (ref 80.0–100.0)
Platelets: 217 10*3/uL (ref 150–400)
RBC: 2.84 MIL/uL — ABNORMAL LOW (ref 4.22–5.81)
RDW: 13.5 % (ref 11.5–15.5)
WBC: 8.8 10*3/uL (ref 4.0–10.5)
nRBC: 0 % (ref 0.0–0.2)

## 2018-12-10 LAB — GLUCOSE, CAPILLARY
Glucose-Capillary: 133 mg/dL — ABNORMAL HIGH (ref 70–99)
Glucose-Capillary: 113 mg/dL — ABNORMAL HIGH (ref 70–99)
Glucose-Capillary: 124 mg/dL — ABNORMAL HIGH (ref 70–99)
Glucose-Capillary: 126 mg/dL — ABNORMAL HIGH (ref 70–99)
Glucose-Capillary: 118 mg/dL — ABNORMAL HIGH (ref 70–99)
Glucose-Capillary: 136 mg/dL — ABNORMAL HIGH (ref 70–99)

## 2018-12-10 LAB — PHOSPHORUS: Phosphorus: 2.6 mg/dL (ref 2.5–4.6)

## 2018-12-10 LAB — MAGNESIUM: Magnesium: 1.6 mg/dL — ABNORMAL LOW (ref 1.7–2.4)

## 2018-12-10 MED ORDER — VISTASEAL 10 ML SINGLE DOSE KIT
PACK | CUTANEOUS | Status: DC | PRN
  Administered 2018-12-09: 10 mL via TOPICAL

## 2018-12-10 MED ORDER — MAGNESIUM SULFATE 2 GM/50ML IV SOLN
2.0000 g | Freq: Once | INTRAVENOUS | Status: AC
  Administered 2018-12-10: 09:00:00 2 g via INTRAVENOUS
  Filled 2018-12-10: qty 50

## 2018-12-10 MED ORDER — OSMOLITE 1.5 CAL PO LIQD
1000.0000 mL | ORAL | Status: DC
  Administered 2018-12-10 – 2018-12-13 (×2): 1000 mL
  Filled 2018-12-10 (×3): qty 1000

## 2018-12-10 NOTE — Progress Notes (Signed)
Initial Nutrition Assessment  DOCUMENTATION CODES:   Not applicable  INTERVENTION:   Initiate Osmolite 1.5 @ 10 ml/hr  As able recommend advance TF to goal rate Osmolite 1.5 @ 60 ml/hr 30 ml Prostat BID Provides: 2360 kcal, 127 grams protein, and 1100 ml free water.    NUTRITION DIAGNOSIS:   Moderate Malnutrition related to chronic illness(cancer) as evidenced by mild muscle depletion, mild fat depletion, moderate muscle depletion.  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 He did have a period of poor appetite but that resolved and he has been eating well.  Pt was 237 lb 08/2018 but is now down to 214 lb. Pt is 23 lb down from his usual weight. As noted above some of this weight loss was intentional and some was unintentional.   Medications reviewed and include: SSI Labs reviewed: Magnesium 1.6 (L) Drains x 2: 160 ml out x 24 hrs  18 F NG   NUTRITION - FOCUSED PHYSICAL EXAM:    Most Recent Value  Orbital Region  Mild depletion  Upper Arm Region  Mild depletion  Thoracic and Lumbar Region  No depletion  Buccal Region  No depletion  Temple Region  Mild depletion  Clavicle Bone Region  Moderate depletion  Clavicle and Acromion Bone Region  Mild depletion  Scapular Bone Region  Unable to assess  Dorsal Hand  No depletion  Patellar Region  Mild depletion  Anterior Thigh Region  Mild depletion   Posterior Calf Region  No depletion  Edema (RD Assessment)  None  Hair  Reviewed  Eyes  Unable to assess  Mouth  Unable to assess  Skin  Reviewed  Nails  Reviewed       Diet Order:   Diet Order            Diet NPO time specified  Diet effective now              EDUCATION NEEDS:   Education needs have been addressed  Skin:  Skin Assessment: Reviewed RN Assessment  Last BM:  unknown  Height:   Ht Readings from Last 1 Encounters:  12/08/2018 5\' 10"  (1.778 m)    Weight:   Wt Readings from Last 1 Encounters:  12/13/2018 97.6 kg    Ideal Body Weight:  75.4 kg  BMI:  Body mass index is 30.87 kg/m.  Estimated Nutritional Needs:   Kcal:  2300-2500  Protein:  115-130 grams  Fluid:  > 2.2 L/day   Maylon Peppers RD, LDN, CNSC [phone] Pager [phone] After Hours Pager

## 2018-12-10 NOTE — Progress Notes (Signed)
1 Day Post-Op   Subjective/Chief Complaint: No significant complaints.  A little sore.     Objective: Vital signs in last 24 hours: Temp:  [97.7 F (36.5 C)-99.7 F (37.6 C)] 99.7 F (37.6 C) (07/10 0800) Pulse Rate:  [65-93] 73 (07/10 0700) Resp:  [13-23] 20 (07/10 0813) BP: (88-142)/(56-80) 142/80 (07/10 0700) SpO2:  [95 %-100 %] 100 % (07/10 0813) Arterial Line BP: (94-167)/(46-65) 161/65 (07/10 0700) Weight:  [97.6 kg] 97.6 kg (07/09 1706)    Intake/Output from previous day: 07/09 0701 - 07/10 0700 In: 4960.2 [I.V.:3726.5; IV Piggyback:1146.6] Out: 2610 [Urine:1950; Drains:160; Blood:500] Intake/Output this shift: No intake/output data recorded.  General appearance: alert, cooperative and mild distress Resp: breathing comfortably Cardio: regular rate and rhythm GI: soft, non distended, approp tender.  drains serosang. Extremities: extremities normal, atraumatic, no cyanosis or edema  Lab Results:  Recent Labs    12/28/2018 2049 12/10/18 0534  WBC 7.7 8.8  HGB 9.2* 9.2*  HCT 27.1* 27.2*  PLT 215 217   BMET Recent Labs    12/20/2018 1240 12/10/18 0534  NA 139 138  K 3.3* 3.8  CL  --  108  CO2  --  21*  GLUCOSE  --  124*  BUN  --  9  CREATININE  --  0.63  CALCIUM  --  8.5*   PT/INR Recent Labs    12/10/18 0534  LABPROT 15.5*  INR 1.2   ABG Recent Labs    12/31/2018 1009 12/01/2018 1240  PHART 7.378 7.342*  HCO3 22.3 22.7    Studies/Results: No results found.  Anti-infectives: Anti-infectives (From admission, onward)   Start     Dose/Rate Route Frequency Ordered Stop   12/28/2018 2000  ceFAZolin (ANCEF) IVPB 2g/100 mL premix     2 g 200 mL/hr over 30 Minutes Intravenous Every 8 hours 12/29/2018 1712 12/13/2018 2116   12/27/2018 0615  ceFAZolin (ANCEF) IVPB 2g/100 mL premix     2 g 200 mL/hr over 30 Minutes Intravenous On call to O.R. 12/20/2018 1155 12/25/2018 1148   12/07/2018 0615  ceFAZolin (ANCEF) 2-4 GM/100ML-% IVPB    Note to Pharmacy: Lorne Skeens   : cabinet override      12/02/2018 0615 12/08/2018 0745      Assessment/Plan: s/p Procedure(s) with comments: LAPAROSCOPY DIAGNOSTIC (N/A) - EPIDURAL WHIPPLE PROCEDURE WITH UMBILICAL HERNIA REPAIR (N/A) continue foley for urinary output monitoring and urinary retention Continue epidural and pca and ofirmev Start tube feeds Flush j tube and ngt Magnesium for hypomagnesemia OOB to chair today.   LOS: 1 day    Stark Klein 12/10/2018

## 2018-12-10 NOTE — Anesthesia Post-op Follow-up Note (Signed)
  Anesthesia Pain Follow-up Note  Patient: Joseph Massey  Day #: 1  Date of Follow-up: 12/10/2018 Time: 3:08 PM  Last Vitals:  Vitals:   12/10/18 1400 12/10/18 1500  BP: (!) 166/88 140/74  Pulse: 76 89  Resp: 18 18  Temp:    SpO2: 96% 97%    Level of Consciousness: alert  Pain: Minimal while sitting. 3/10 earlier today, but PCA helped. 5/10 with moving from bed to chair.  Side Effects:None  Catheter Site Exam:clean, dry, no drainage  Epidural / Intrathecal (From admission, onward)   Start     Dose/Rate Route Frequency Ordered Stop   12/02/2018 1545  ropivacaine (PF) 2 mg/mL (0.2%) (NAROPIN) injection     6 mL/hr 6 mL/hr  Epidural Continuous 12/18/2018 1534         Plan: Modify therapy to improve pain control at surgeon's request  I increased the infusion to 8 ml/hr to try to limit need for PCA. Otherwise, patient very satisfied with pain control.    Nolon Nations

## 2018-12-10 NOTE — Progress Notes (Signed)
At 2000, patient's BP was 88/56 MAP 66 per cuff pressure. A-line pressure was 109/48 MAP 62 with a good waveform. Patient had normal mentation and did not experience any side effects. Notified Dr. Donne Hazel, new verbal orders to obtain a post surgery CBC, give 250 mL of 5% albumin, and keep MAP >60 per the a-line.

## 2018-12-11 LAB — COMPREHENSIVE METABOLIC PANEL
ALT: 128 U/L — ABNORMAL HIGH (ref 0–44)
AST: 79 U/L — ABNORMAL HIGH (ref 15–41)
Albumin: 3 g/dL — ABNORMAL LOW (ref 3.5–5.0)
Alkaline Phosphatase: 63 U/L (ref 38–126)
Anion gap: 10 (ref 5–15)
BUN: 7 mg/dL — ABNORMAL LOW (ref 8–23)
CO2: 22 mmol/L (ref 22–32)
Calcium: 8.3 mg/dL — ABNORMAL LOW (ref 8.9–10.3)
Chloride: 103 mmol/L (ref 98–111)
Creatinine, Ser: 0.64 mg/dL (ref 0.61–1.24)
GFR calc Af Amer: >60 mL/min (ref >60)
GFR calc non Af Amer: >60 mL/min (ref >60)
Glucose, Bld: 154 mg/dL — ABNORMAL HIGH (ref 70–99)
Potassium: 3.6 mmol/L (ref 3.5–5.1)
Sodium: 135 mmol/L (ref 135–145)
Total Bilirubin: 1 mg/dL (ref 0.3–1.2)
Total Protein: 5.7 g/dL — ABNORMAL LOW (ref 6.5–8.1)

## 2018-12-11 LAB — CBC
HCT: 30.3 % — ABNORMAL LOW (ref 39.0–52.0)
Hemoglobin: 10.3 g/dL — ABNORMAL LOW (ref 13.0–17.0)
MCH: 32.9 pg (ref 26.0–34.0)
MCHC: 34 g/dL (ref 30.0–36.0)
MCV: 96.8 fL (ref 80.0–100.0)
Platelets: 218 10*3/uL (ref 150–400)
RBC: 3.13 MIL/uL — ABNORMAL LOW (ref 4.22–5.81)
RDW: 13.5 % (ref 11.5–15.5)
WBC: 9.3 10*3/uL (ref 4.0–10.5)
nRBC: 0 % (ref 0.0–0.2)

## 2018-12-11 LAB — GLUCOSE, CAPILLARY
Glucose-Capillary: 143 mg/dL — ABNORMAL HIGH (ref 70–99)
Glucose-Capillary: 137 mg/dL — ABNORMAL HIGH (ref 70–99)
Glucose-Capillary: 105 mg/dL — ABNORMAL HIGH (ref 70–99)
Glucose-Capillary: 117 mg/dL — ABNORMAL HIGH (ref 70–99)
Glucose-Capillary: 130 mg/dL — ABNORMAL HIGH (ref 70–99)
Glucose-Capillary: 112 mg/dL — ABNORMAL HIGH (ref 70–99)

## 2018-12-11 LAB — PHOSPHORUS
Phosphorus: 2.5 mg/dL (ref 2.5–4.6)
Phosphorus: 2.1 mg/dL — ABNORMAL LOW (ref 2.5–4.6)

## 2018-12-11 LAB — MAGNESIUM
Magnesium: 1.8 mg/dL (ref 1.7–2.4)
Magnesium: 1.6 mg/dL — ABNORMAL LOW (ref 1.7–2.4)

## 2018-12-11 NOTE — Anesthesia Post-op Follow-up Note (Signed)
  Anesthesia Pain Follow-up Note  Patient: Joseph Massey  Day #: 2  Date of Follow-up: 12/11/2018 Time: 12:42 PM  Last Vitals:  Vitals:   12/11/18 0800 12/11/18 0900  BP: (!) 131/101 (!) 156/85  Pulse: 89 89  Resp: (!) 24 (!) 21  Temp: (!) 38.3 C   SpO2: 99% 100%    Level of Consciousness: alert  Pain: mild   Side Effects:None  Catheter Site Exam: Clean, dry, intact dressing. Non-tender.  Epidural / Intrathecal (From admission, onward)   Start     Dose/Rate Route Frequency Ordered Stop   12/24/2018 1545  ropivacaine (PF) 2 mg/mL (0.2%) (NAROPIN) injection     8 mL/hr 8 mL/hr  Epidural Continuous 12/19/2018 1534         Plan: Continue current therapy of postop epidural at surgeon's request  Audry Pili

## 2018-12-11 NOTE — Evaluation (Addendum)
Physical Therapy Evaluation Patient Details Name: Joseph Massey MRN: [id number] DOB: 02/17/1951 Today's Date: 12/11/2018   History of Present Illness  Patient is a 68 y/o male who presents with pancreatic mass s/p whipple procedure with umbilical hernia repair 7/9. PMH includes Rt THA, HTN, obesity.  Clinical Impression  Patient presents with post surgical deficits, impaired balance and impaired mobility s/p above. Pt independent PTA and used to be a traveling Biomedical scientist. Today, pt requires assist for bed mobility and Min A for ambulation with use of RW for safety. Reports needing right shoulder replacement and left THA in the near future so gets stiff easily. Pt uses SPC for ambulation at home and independent with ADLs. Has support of wife. Education re: log roll technique and bracing. Will follow acutely to maximize independence and mobility prior to return home.     Follow Up Recommendations Home health PT;Supervision for mobility/OOB    Equipment Recommendations  None recommended by PT    Recommendations for Other Services       Precautions / Restrictions Precautions Precautions: Fall Precaution Comments: PCA pump; 2 drains; NG tube to suction Restrictions Weight Bearing Restrictions: No      Mobility  Bed Mobility Overal bed mobility: Needs Assistance Bed Mobility: Rolling;Sidelying to Sit Rolling: Min guard Sidelying to sit: Mod assist;HOB elevated       General bed mobility comments: Cues for log roll technique; Able to reach for rail towards left side and assist to elevate trunk.  Transfers Overall transfer level: Needs assistance Equipment used: Rolling walker (2 wheeled) Transfers: Sit to/from Stand Sit to Stand: Min guard         General transfer comment: Min guard for safety. Stood from [employer]. Transferred to chair post ambulation.  Ambulation/Gait Ambulation/Gait assistance: Min assist Gait Distance (Feet): 300 Feet Assistive device: Rolling walker (2  wheeled) Gait Pattern/deviations: Antalgic;Step-through pattern;Decreased stride length;Trunk flexed Gait velocity: fast at times.   General Gait Details: Antalgic like gait with Min A at times for balance and use of RW for support. Cues for RW management/proximity.  Stairs            Wheelchair Mobility    Modified Rankin (Stroke Patients Only)       Balance Overall balance assessment: Needs assistance Sitting-balance support: Feet supported;No upper extremity supported Sitting balance-Leahy Scale: Good     Standing balance support: During functional activity Standing balance-Leahy Scale: Fair Standing balance comment: Able to stand statically without UE support but does better with UE support for dynamic tasks.                             Pertinent Vitals/Pain Pain Assessment: 0-10 Pain Score: 2  Pain Location: abdomen Pain Descriptors / Indicators: Sore;Operative site guarding Pain Intervention(s): Repositioned;Monitored during session    [place] expects to be discharged to:: Private residence Living Arrangements: Spouse/significant other Available Help at Discharge: Family;Available 24 hours/day Type of Home: House Home Access: Stairs to enter Entrance Stairs-Rails: None Entrance Stairs-Number of Steps: 1 Home Layout: One level Home Equipment: Cane - single point;Walker - 2 wheels;Bedside commode      Prior Function Level of Independence: Independent with assistive device(s)         Comments: Uses SPC for ambulation. 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     Hand Dominance   Dominant Hand: Right    Extremity/Trunk Assessment   Upper  Extremity Assessment Upper Extremity Assessment: Defer to OT evaluation(Needs a shoulder replacement on RUE)    Lower Extremity Assessment Lower Extremity Assessment: LLE deficits/detail LLE Deficits / Details: Hx of arthritis and needs a hip replacement.        Communication   Communication: No difficulties(raspy voice from intubated tube)  Cognition Arousal/Alertness: Awake/alert Behavior During Therapy: WFL for tasks assessed/performed Overall Cognitive Status: Within Functional Limits for tasks assessed                                 General Comments: for basic mobility tasks.      General Comments      Exercises     Assessment/Plan    PT Assessment Patient needs continued PT services  PT Problem List Decreased mobility;Decreased range of motion;Pain;Decreased balance;Decreased knowledge of use of DME;Decreased skin integrity;Decreased knowledge of precautions       PT Treatment Interventions Therapeutic activities;DME instruction;Gait training;Therapeutic exercise;Patient/family education;Balance training;Functional mobility training    PT Goals (Current goals can be found in the Care Plan section)  Acute Rehab PT Goals Patient Stated Goal: to get better and go home PT Goal Formulation: With patient Time For Goal Achievement: 12/25/18 Potential to Achieve Goals: Good    Frequency Min 3X/week   Barriers to discharge        Co-evaluation               AM-PAC PT "6 Clicks" Mobility  Outcome Measure Help needed turning from your back to your side while in a flat bed without using bedrails?: A Little Help needed moving from lying on your back to sitting on the side of a flat bed without using bedrails?: A Lot Help needed moving to and from a bed to a chair (including a wheelchair)?: A Little Help needed standing up from a chair using your arms (e.g., wheelchair or bedside chair)?: A Little Help needed to walk in hospital room?: A Little Help needed climbing 3-5 steps with a railing? : A Lot 6 Click Score: 16    End of Session Equipment Utilized During Treatment: Gait belt Activity Tolerance: Patient tolerated treatment well Patient left: in chair;with call bell/phone within reach;with  nursing/sitter in room Nurse Communication: Mobility status PT Visit Diagnosis: Pain;Unsteadiness on feet (R26.81);Difficulty in walking, not elsewhere classified (R26.2) Pain - part of body: (abdomen)    Time: 5631-4970 PT Time Calculation (min) (ACUTE ONLY): 18 min   Charges:   PT Evaluation $PT Eval Moderate Complexity: 1 Mod          Wray Kearns, PT, DPT Acute Rehabilitation Services Pager [phone] Office [phone]      [place] 12/11/2018, 10:35 AM

## 2018-12-11 NOTE — Progress Notes (Signed)
2 Days Post-Op   Subjective/Chief Complaint: Up in chair, walked with therapies  Objective: Vital signs in last 24 hours: Temp:  [98.6 F (37 C)-100.9 F (38.3 C)] 100.9 F (38.3 C) (07/11 0800) Pulse Rate:  [73-106] 89 (07/11 0800) Resp:  [14-31] 24 (07/11 0800) BP: (95-166)/(71-122) 131/101 (07/11 0800) SpO2:  [92 %-100 %] 99 % (07/11 0800) Weight:  [98.1 kg] 98.1 kg (07/11 0500) Last BM Date: (PTA)  Intake/Output from previous day: 07/10 0701 - 07/11 0700 In: 2835.2 [I.V.:2249.6; NG/GT:201.7; IV Piggyback:150.1] Out: 1460 [Urine:1325; Emesis/NG output:30; Drains:105] Intake/Output this shift: No intake/output data recorded.  General appearance: cooperative Throat: voice hoarse, NGT Resp: clear to auscultation bilaterally Cardio: regular rate and rhythm GI: soft, few BS, dressing dry stain, drains SS, J tube TF  Lab Results:  Recent Labs    12/10/18 0534 12/11/18 0548  WBC 8.8 9.3  HGB 9.2* 10.3*  HCT 27.2* 30.3*  PLT 217 218   BMET Recent Labs    12/10/18 0534 12/11/18 0548  NA 138 135  K 3.8 3.6  CL 108 103  CO2 21* 22  GLUCOSE 124* 154*  BUN 9 7*  CREATININE 0.63 0.64  CALCIUM 8.5* 8.3*   PT/INR Recent Labs    12/10/18 0534  LABPROT 15.5*  INR 1.2   ABG Recent Labs    12/03/2018 1009 12/08/2018 1240  PHART 7.378 7.342*  HCO3 22.3 22.7    Studies/Results: No results found.  Anti-infectives: Anti-infectives (From admission, onward)   Start     Dose/Rate Route Frequency Ordered Stop   12/25/2018 2000  ceFAZolin (ANCEF) IVPB 2g/100 mL premix     2 g 200 mL/hr over 30 Minutes Intravenous Every 8 hours 12/16/2018 1712 12/16/2018 2116   12/11/2018 0615  ceFAZolin (ANCEF) IVPB 2g/100 mL premix     2 g 200 mL/hr over 30 Minutes Intravenous On call to O.R. 12/31/2018 1021 12/10/2018 1148   12/08/2018 0615  ceFAZolin (ANCEF) 2-4 GM/100ML-% IVPB    Note to Pharmacy: Lorne Skeens   : cabinet override      12/12/2018 0615 12/12/2018 0745       Assessment/Plan: s/p Procedure(s) with comments: LAPAROSCOPY DIAGNOSTIC (N/A) - EPIDURAL WHIPPLE PROCEDURE WITH UMBILICAL HERNIA REPAIR (N/A) POD#1 D/C NGT and give ice chips Low rate J tube feeds Continue foley until tomorrow PT/OT  LOS: 2 days    Zenovia Jarred 12/11/2018

## 2018-12-12 LAB — CBC
HCT: 27.4 % — ABNORMAL LOW (ref 39.0–52.0)
Hemoglobin: 9.1 g/dL — ABNORMAL LOW (ref 13.0–17.0)
MCH: 32.3 pg (ref 26.0–34.0)
MCHC: 33.2 g/dL (ref 30.0–36.0)
MCV: 97.2 fL (ref 80.0–100.0)
Platelets: 218 10*3/uL (ref 150–400)
RBC: 2.82 MIL/uL — ABNORMAL LOW (ref 4.22–5.81)
RDW: 13.4 % (ref 11.5–15.5)
WBC: 3.9 10*3/uL — ABNORMAL LOW (ref 4.0–10.5)
nRBC: 0 % (ref 0.0–0.2)

## 2018-12-12 LAB — COMPREHENSIVE METABOLIC PANEL
ALT: 83 U/L — ABNORMAL HIGH (ref 0–44)
AST: 32 U/L (ref 15–41)
Albumin: 2.6 g/dL — ABNORMAL LOW (ref 3.5–5.0)
Alkaline Phosphatase: 56 U/L (ref 38–126)
Anion gap: 8 (ref 5–15)
BUN: 10 mg/dL (ref 8–23)
CO2: 23 mmol/L (ref 22–32)
Calcium: 8.3 mg/dL — ABNORMAL LOW (ref 8.9–10.3)
Chloride: 103 mmol/L (ref 98–111)
Creatinine, Ser: 0.53 mg/dL — ABNORMAL LOW (ref 0.61–1.24)
GFR calc Af Amer: >60 mL/min (ref >60)
GFR calc non Af Amer: >60 mL/min (ref >60)
Glucose, Bld: 124 mg/dL — ABNORMAL HIGH (ref 70–99)
Potassium: 3.9 mmol/L (ref 3.5–5.1)
Sodium: 134 mmol/L — ABNORMAL LOW (ref 135–145)
Total Bilirubin: 0.9 mg/dL (ref 0.3–1.2)
Total Protein: 5.5 g/dL — ABNORMAL LOW (ref 6.5–8.1)

## 2018-12-12 LAB — MAGNESIUM
Magnesium: 1.9 mg/dL (ref 1.7–2.4)
Magnesium: 1.8 mg/dL (ref 1.7–2.4)

## 2018-12-12 LAB — GLUCOSE, CAPILLARY
Glucose-Capillary: 108 mg/dL — ABNORMAL HIGH (ref 70–99)
Glucose-Capillary: 109 mg/dL — ABNORMAL HIGH (ref 70–99)
Glucose-Capillary: 113 mg/dL — ABNORMAL HIGH (ref 70–99)
Glucose-Capillary: 117 mg/dL — ABNORMAL HIGH (ref 70–99)
Glucose-Capillary: 118 mg/dL — ABNORMAL HIGH (ref 70–99)
Glucose-Capillary: 129 mg/dL — ABNORMAL HIGH (ref 70–99)

## 2018-12-12 LAB — PHOSPHORUS
Phosphorus: 2.7 mg/dL (ref 2.5–4.6)
Phosphorus: 2.4 mg/dL — ABNORMAL LOW (ref 2.5–4.6)

## 2018-12-12 MED ORDER — HYDROMORPHONE HCL 1 MG/ML IJ SOLN
1.0000 mg | INTRAMUSCULAR | Status: DC | PRN
  Administered 2018-12-12 – 2018-12-15 (×16): 1 mg via INTRAVENOUS
  Filled 2018-12-12 (×16): qty 1

## 2018-12-12 MED ORDER — BISACODYL 10 MG RE SUPP
10.0000 mg | Freq: Every day | RECTAL | Status: DC | PRN
  Administered 2018-12-13 – 2018-12-14 (×2): 10 mg via RECTAL
  Filled 2018-12-12 (×2): qty 1

## 2018-12-12 NOTE — Progress Notes (Signed)
Patient had seven beats of V-tach. Patient asymptomatic at this thime. Vital signs within normal limits. MD notified, will continue to monitor.

## 2018-12-12 NOTE — Anesthesia Post-op Follow-up Note (Signed)
  Anesthesia Pain Follow-up Note  Patient: Joseph Massey  Day #: 3  Date of Follow-up: 12/12/2018 Time: 11:18 AM  Last Vitals:  Vitals:   12/12/18 0800 12/12/18 1000  BP: 135/85   Pulse: 79 86  Resp: 17 (!) 23  Temp: 36.9 C   SpO2: 99% 99%    Level of Consciousness: alert  Pain: 1 /10   Side Effects:None  Catheter Site Exam:clean, dry, no drainage  Epidural / Intrathecal (From admission, onward)   Start     Dose/Rate Route Frequency Ordered Stop   12/06/2018 1545  ropivacaine (PF) 2 mg/mL (0.2%) (NAROPIN) injection     8 mL/hr 8 mL/hr  Epidural Continuous 12/02/2018 1534         Plan: Continue current therapy of postop epidural at surgeon's request  Effie Berkshire

## 2018-12-12 NOTE — Progress Notes (Signed)
3 Days Post-Op   Subjective/Chief Complaint: BLOATED SOME FLATUS    Objective: Vital signs in last 24 hours: Temp:  [98.6 F (37 C)-99.6 F (37.6 C)] 98.6 F (37 C) (07/12 0400) Pulse Rate:  [70-114] 75 (07/12 0600) Resp:  [18-33] 20 (07/12 0600) BP: (108-174)/(59-96) 152/84 (07/12 0600) SpO2:  [92 %-100 %] 97 % (07/12 0600) Weight:  [97.2 kg] 97.2 kg (07/12 0500) Last BM Date: (pta)  Intake/Output from previous day: 07/11 0701 - 07/12 0700 In: 1296.1 [I.V.:1098.1; NG/GT:110] Out: 1120 [Urine:920; Drains:200] Intake/Output this shift: No intake/output data recorded.  General appearance: alert and cooperative Resp: clear to auscultation bilaterally Cardio: regular rate and rhythm, S1, S2 normal, no murmur, click, rub or gallop Incision/Wound: CDI drains serous distended but not tender   Lab Results:  Recent Labs    12/11/18 0548 12/12/18 0726  WBC 9.3 3.9*  HGB 10.3* 9.1*  HCT 30.3* 27.4*  PLT 218 218   BMET Recent Labs    12/11/18 0548 12/12/18 0726  NA 135 134*  K 3.6 3.9  CL 103 103  CO2 22 23  GLUCOSE 154* 124*  BUN 7* 10  CREATININE 0.64 0.53*  CALCIUM 8.3* 8.3*   PT/INR Recent Labs    12/10/18 0534  LABPROT 15.5*  INR 1.2   ABG Recent Labs    12/07/2018 1009 12/12/2018 1240  PHART 7.378 7.342*  HCO3 22.3 22.7    Studies/Results: No results found.  Anti-infectives: Anti-infectives (From admission, onward)   Start     Dose/Rate Route Frequency Ordered Stop   12/29/2018 2000  ceFAZolin (ANCEF) IVPB 2g/100 mL premix     2 g 200 mL/hr over 30 Minutes Intravenous Every 8 hours 12/22/2018 1712 12/16/2018 2116   12/13/2018 0615  ceFAZolin (ANCEF) IVPB 2g/100 mL premix     2 g 200 mL/hr over 30 Minutes Intravenous On call to O.R. 12/08/2018 1610 12/16/2018 1148   12/08/2018 0615  ceFAZolin (ANCEF) 2-4 GM/100ML-% IVPB    Note to Pharmacy: Lorne Skeens   : cabinet override      12/16/2018 0615 12/11/2018 0745      Assessment/Plan: s/p Procedure(s) with  comments: LAPAROSCOPY DIAGNOSTIC (N/A) - EPIDURAL WHIPPLE PROCEDURE WITH UMBILICAL HERNIA REPAIR (N/A) To floor  Ambulate  D/C FOLEY  Continue ice chips for now   LOS: 3 days    Marcello Moores A Lida Berkery 12/12/2018

## 2018-12-13 LAB — CBC
HCT: 24.9 % — ABNORMAL LOW (ref 39.0–52.0)
Hemoglobin: 8.5 g/dL — ABNORMAL LOW (ref 13.0–17.0)
MCH: 32 pg (ref 26.0–34.0)
MCHC: 34.1 g/dL (ref 30.0–36.0)
MCV: 93.6 fL (ref 80.0–100.0)
Platelets: 220 10*3/uL (ref 150–400)
RBC: 2.66 MIL/uL — ABNORMAL LOW (ref 4.22–5.81)
RDW: 13.2 % (ref 11.5–15.5)
WBC: 5 10*3/uL (ref 4.0–10.5)
nRBC: 0 % (ref 0.0–0.2)

## 2018-12-13 LAB — COMPREHENSIVE METABOLIC PANEL
ALT: 57 U/L — ABNORMAL HIGH (ref 0–44)
AST: 22 U/L (ref 15–41)
Albumin: 2.3 g/dL — ABNORMAL LOW (ref 3.5–5.0)
Alkaline Phosphatase: 52 U/L (ref 38–126)
Anion gap: 9 (ref 5–15)
BUN: 10 mg/dL (ref 8–23)
CO2: 23 mmol/L (ref 22–32)
Calcium: 8.3 mg/dL — ABNORMAL LOW (ref 8.9–10.3)
Chloride: 104 mmol/L (ref 98–111)
Creatinine, Ser: 0.48 mg/dL — ABNORMAL LOW (ref 0.61–1.24)
GFR calc Af Amer: >60 mL/min (ref >60)
GFR calc non Af Amer: >60 mL/min (ref >60)
Glucose, Bld: 134 mg/dL — ABNORMAL HIGH (ref 70–99)
Potassium: 3.6 mmol/L (ref 3.5–5.1)
Sodium: 136 mmol/L (ref 135–145)
Total Bilirubin: 0.7 mg/dL (ref 0.3–1.2)
Total Protein: 5.4 g/dL — ABNORMAL LOW (ref 6.5–8.1)

## 2018-12-13 LAB — GLUCOSE, CAPILLARY
Glucose-Capillary: 112 mg/dL — ABNORMAL HIGH (ref 70–99)
Glucose-Capillary: 112 mg/dL — ABNORMAL HIGH (ref 70–99)
Glucose-Capillary: 130 mg/dL — ABNORMAL HIGH (ref 70–99)
Glucose-Capillary: 142 mg/dL — ABNORMAL HIGH (ref 70–99)
Glucose-Capillary: 126 mg/dL — ABNORMAL HIGH (ref 70–99)

## 2018-12-13 MED ORDER — ALUM & MAG HYDROXIDE-SIMETH 200-200-20 MG/5ML PO SUSP
30.0000 mL | Freq: Four times a day (QID) | ORAL | Status: DC | PRN
  Administered 2018-12-13 – 2018-12-17 (×8): 30 mL via ORAL
  Filled 2018-12-13 (×8): qty 30

## 2018-12-13 MED ORDER — PANTOPRAZOLE SODIUM 40 MG PO TBEC
40.0000 mg | DELAYED_RELEASE_TABLET | Freq: Two times a day (BID) | ORAL | Status: DC
  Administered 2018-12-13 – 2018-12-14 (×3): 40 mg via ORAL
  Filled 2018-12-13 (×3): qty 1

## 2018-12-13 NOTE — Care Management Important Message (Signed)
Important Message  Patient Details  Name: Joseph Massey MRN: [id number] Date of Birth: 15-Nov-1950   Medicare Important Message Given:  Yes     Memory Argue 12/13/2018, 4:14 PM   PATIENT HAS SIGNED IM

## 2018-12-13 NOTE — Progress Notes (Signed)
Nutrition Follow-up  DOCUMENTATION CODES:   Not applicable  INTERVENTION:   Osmolite 1.5 @ 10 ml/hr via J-tube  As able recommend advance TF to goal rate Osmolite 1.5 @ 60 ml/hr 30 ml Prostat BID Provides: 2360 kcal, 127 grams protein, and 1100 ml free water.    NUTRITION DIAGNOSIS:   Moderate Malnutrition related to chronic illness(cancer) as evidenced by mild muscle depletion, mild fat depletion, moderate muscle depletion. 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   Per surgery pt has had 2 BMs but no significant flatus so no TF advancement today.   7/10 trickle TF started 7/13 NG out and started on clear liquids  Medications and labs reviewed  Drains x 2: 230 ml out x 24 hrs    Diet Order:   Diet Order            Diet clear liquid Room service appropriate? Yes; Fluid consistency: Thin  Diet effective now              EDUCATION NEEDS:   Education needs have been addressed  Skin:  Skin Assessment: Reviewed RN Assessment  Last BM:  7/13  Height:   Ht Readings from Last 1 Encounters:  12/12/2018 5\' 10"  (1.778 m)    Weight:   Wt Readings from Last 1 Encounters:  12/13/18 99.5 kg    Ideal Body Weight:  75.4 kg  BMI:  Body mass index is 31.47 kg/m.  Estimated Nutritional Needs:   Kcal:  2300-2500  Protein:  115-130 grams  Fluid:  > 2.2 L/day   Maylon Peppers RD, LDN, CNSC [phone] Pager [phone] After Hours Pager

## 2018-12-13 NOTE — Progress Notes (Signed)
Physical Therapy Treatment Patient Details Name: Joseph Massey MRN: [id number] DOB: [birth year] Today's Date: 12/13/2018    History of Present Illness Patient is a 68 y/o male who presents with pancreatic mass s/p whipple procedure with umbilical hernia repair 7/9. PMH includes Rt THA, HTN, obesity.    PT Comments    Pt slow and guarded but eager to mobilize.  Discomfort still present in abdomen, he reports as tightness.  Pt performed gt training then requested to sit on commode in efforts to have a BM.  Pt left sitting on commode and instructed patient to call for help for back to bed.  Pt continues to progress well and on track to return home.  Plan for higher level balance activities next session.    Follow Up Recommendations  Home health PT;Supervision for mobility/OOB     Equipment Recommendations  None recommended by PT    Recommendations for Other Services       Precautions / Restrictions Precautions Precautions: Fall Precaution Comments: PCA pump; 2 drains; NG tube to suction Restrictions Weight Bearing Restrictions: No    Mobility  Bed Mobility Overal bed mobility: Needs Assistance Bed Mobility: Rolling;Sidelying to Sit Rolling: Min guard Sidelying to sit: Mod assist       General bed mobility comments: Pt continues to log roll with ease but required moderate assistance to elevate trunk into sitting edge of bed.  Transfers Overall transfer level: Needs assistance Equipment used: Rolling walker (2 wheeled) Transfers: Sit to/from Stand Sit to Stand: Min guard         General transfer comment: Continues to require min guard for safety and integrity of lines and leads.  Ambulation/Gait Ambulation/Gait assistance: Min assist;Min guard(min x1 for LOB) Gait Distance (Feet): 350 Feet Assistive device: Rolling walker (2 wheeled) Gait Pattern/deviations: Step-through pattern;Trunk flexed     General Gait Details: Minor LOB to the L when removing hand from  walker to adjust his mask.  Cues to keep hands on RW when in motion.  Otherwise min guard for gt training.   Stairs             Wheelchair Mobility    Modified Rankin (Stroke Patients Only)       Balance Overall balance assessment: Needs assistance Sitting-balance support: Feet supported;No upper extremity supported Sitting balance-Leahy Scale: Good     Standing balance support: During functional activity Standing balance-Leahy Scale: Fair                              Cognition Arousal/Alertness: Awake/alert Behavior During Therapy: WFL for tasks assessed/performed Overall Cognitive Status: Within Functional Limits for tasks assessed                                 General Comments: for basic mobility tasks.      Exercises      General Comments        Pertinent Vitals/Pain Pain Assessment: 0-10 Pain Score: 2  Pain Location: abdomen Pain Descriptors / Indicators: Sore;Operative site guarding Pain Intervention(s): Monitored during session;Repositioned    Home Living                      Prior Function            PT Goals (current goals can now be found in the care plan section) Acute Rehab PT Goals Patient  Stated Goal: to get better and go home Potential to Achieve Goals: Good Progress towards PT goals: Progressing toward goals    Frequency    Min 3X/week      PT Plan Current plan remains appropriate    Co-evaluation              AM-PAC PT "6 Clicks" Mobility   Outcome Measure  Help needed turning from your back to your side while in a flat bed without using bedrails?: A Little Help needed moving from lying on your back to sitting on the side of a flat bed without using bedrails?: A Lot Help needed moving to and from a bed to a chair (including a wheelchair)?: A Little Help needed standing up from a chair using your arms (e.g., wheelchair or bedside chair)?: A Little Help needed to walk in  hospital room?: A Little Help needed climbing 3-5 steps with a railing? : A Little 6 Click Score: 17    End of Session   Activity Tolerance: Patient tolerated treatment well Patient left: with call bell/phone within reach(pt sitting on commode and educated to pull string when finished.) Nurse Communication: Mobility status(informed nurse tech that patient will need assistance when getting off commode.) PT Visit Diagnosis: Pain;Unsteadiness on feet (R26.81);Difficulty in walking, not elsewhere classified (R26.2) Pain - part of body: (abdomen)     Time: 1607-3710 PT Time Calculation (min) (ACUTE ONLY): 13 min  Charges:  $Gait Training: 8-22 mins                     Governor Rooks, PTA Acute Rehabilitation Services Pager [phone] Office [phone]     Khianna Blazina Eli Hose 12/13/2018, 5:19 PM

## 2018-12-13 NOTE — Plan of Care (Signed)
  Problem: Clinical Measurements: Goal: Ability to maintain clinical measurements within normal limits will improve Outcome: Progressing   Problem: Clinical Measurements: Goal: Ability to maintain clinical measurements within normal limits will improve Outcome: Progressing   Problem: Elimination: Goal: Will not experience complications related to bowel motility Outcome: Progressing

## 2018-12-13 NOTE — Progress Notes (Signed)
4 Days Post-Op   Subjective/Chief Complaint: Had 2 bowel movements, but no significant flatus.  No n/v.  No significant belching.  Transferred to floor. Complains of reflux    Objective: Vital signs in last 24 hours: Temp:  [98.1 F (36.7 C)-99.3 F (37.4 C)] 98.1 F (36.7 C) (07/13 0512) Pulse Rate:  [80-82] 82 (07/13 0512) Resp:  [16-22] 16 (07/13 0512) BP: (111-123)/(66-81) 121/81 (07/13 0512) SpO2:  [94 %-99 %] 99 % (07/13 0512) Weight:  [99.5 kg] 99.5 kg (07/13 0512) Last BM Date: 12/13/18  Intake/Output from previous day: 07/12 0701 - 07/13 0700 In: 2437.7 [I.V.:2000; NG/GT:309.7] Out: 1245 [Urine:855; Drains:390] Intake/Output this shift: Total I/O In: -  Out: 225 [Urine:225]  General appearance: alert and cooperative Resp: clear to auscultation bilaterally Abd:  Soft, sl distended.  Dressing c/d/i.   Incision/Wound: CDI drains serous   Lab Results:  Recent Labs    12/12/18 0726 12/13/18 0327  WBC 3.9* 5.0  HGB 9.1* 8.5*  HCT 27.4* 24.9*  PLT 218 220   BMET Recent Labs    12/12/18 0726 12/13/18 0327  NA 134* 136  K 3.9 3.6  CL 103 104  CO2 23 23  GLUCOSE 124* 134*  BUN 10 10  CREATININE 0.53* 0.48*  CALCIUM 8.3* 8.3*   PT/INR No results for input(s): LABPROT, INR in the last 72 hours. ABG No results for input(s): PHART, HCO3 in the last 72 hours.  Invalid input(s): PCO2, PO2  Studies/Results: No results found.  Anti-infectives: Anti-infectives (From admission, onward)   Start     Dose/Rate Route Frequency Ordered Stop   12/05/2018 2000  ceFAZolin (ANCEF) IVPB 2g/100 mL premix     2 g 200 mL/hr over 30 Minutes Intravenous Every 8 hours 12/29/2018 1712 12/26/2018 2116   12/21/2018 0615  ceFAZolin (ANCEF) IVPB 2g/100 mL premix     2 g 200 mL/hr over 30 Minutes Intravenous On call to O.R. 12/27/2018 3354 12/13/2018 1148   12/24/2018 0615  ceFAZolin (ANCEF) 2-4 GM/100ML-% IVPB    Note to Pharmacy: Lorne Skeens   : cabinet override      12/06/2018 0615  12/01/2018 0745      Assessment/Plan: s/p Procedure(s) with comments: LAPAROSCOPY DIAGNOSTIC (N/A) - EPIDURAL WHIPPLE PROCEDURE WITH UMBILICAL HERNIA REPAIR (N/A) Clears.  Given bloating, hold on tube feed advance. Ambulate. Await path for cholangioca. Increase protonix to BID and add maalox    LOS: 4 days    Stark Klein 12/13/2018

## 2018-12-13 NOTE — Anesthesia Post-op Follow-up Note (Signed)
  Anesthesia Pain Follow-up Note  Patient: Joseph Massey  Day #: 4  Date of Follow-up: 12/13/2018 Time: 2:43 PM  Last Vitals:  Vitals:   12/13/18 0512 12/13/18 1428  BP: 121/81 119/81  Pulse: 82 88  Resp: 16   Temp: 36.7 C 37.2 C  SpO2: 99% 96%    Level of Consciousness: alert  Pain: none   Side Effects:None  Catheter Site Exam:clean, dry, no drainage  Epidural / Intrathecal (From admission, onward)   Start     Dose/Rate Route Frequency Ordered Stop   12/21/2018 1545  ropivacaine (PF) 2 mg/mL (0.2%) (NAROPIN) injection     8 mL/hr 8 mL/hr  Epidural Continuous 12/12/2018 1534         Plan: Continue current therapy of postop epidural at surgeon's request  Ayiana Winslett,W. EDMOND

## 2018-12-14 LAB — GLUCOSE, CAPILLARY
Glucose-Capillary: 101 mg/dL — ABNORMAL HIGH (ref 70–99)
Glucose-Capillary: 106 mg/dL — ABNORMAL HIGH (ref 70–99)
Glucose-Capillary: 110 mg/dL — ABNORMAL HIGH (ref 70–99)
Glucose-Capillary: 121 mg/dL — ABNORMAL HIGH (ref 70–99)
Glucose-Capillary: 99 mg/dL (ref 70–99)

## 2018-12-14 LAB — CBC
HCT: 26 % — ABNORMAL LOW (ref 39.0–52.0)
Hemoglobin: 8.8 g/dL — ABNORMAL LOW (ref 13.0–17.0)
MCH: 32.4 pg (ref 26.0–34.0)
MCHC: 33.8 g/dL (ref 30.0–36.0)
MCV: 95.6 fL (ref 80.0–100.0)
Platelets: 241 10*3/uL (ref 150–400)
RBC: 2.72 MIL/uL — ABNORMAL LOW (ref 4.22–5.81)
RDW: 13.6 % (ref 11.5–15.5)
WBC: 5.7 10*3/uL (ref 4.0–10.5)
nRBC: 0 % (ref 0.0–0.2)

## 2018-12-14 LAB — COMPREHENSIVE METABOLIC PANEL
ALT: 52 U/L — ABNORMAL HIGH (ref 0–44)
AST: 21 U/L (ref 15–41)
Albumin: 2.4 g/dL — ABNORMAL LOW (ref 3.5–5.0)
Alkaline Phosphatase: 62 U/L (ref 38–126)
Anion gap: 8 (ref 5–15)
BUN: 8 mg/dL (ref 8–23)
CO2: 24 mmol/L (ref 22–32)
Calcium: 8.2 mg/dL — ABNORMAL LOW (ref 8.9–10.3)
Chloride: 104 mmol/L (ref 98–111)
Creatinine, Ser: 0.5 mg/dL — ABNORMAL LOW (ref 0.61–1.24)
GFR calc Af Amer: >60 mL/min (ref >60)
GFR calc non Af Amer: >60 mL/min (ref >60)
Glucose, Bld: 126 mg/dL — ABNORMAL HIGH (ref 70–99)
Potassium: 3.6 mmol/L (ref 3.5–5.1)
Sodium: 136 mmol/L (ref 135–145)
Total Bilirubin: 1 mg/dL (ref 0.3–1.2)
Total Protein: 5.4 g/dL — ABNORMAL LOW (ref 6.5–8.1)

## 2018-12-14 MED ORDER — SCOPOLAMINE 1 MG/3DAYS TD PT72
1.0000 | MEDICATED_PATCH | TRANSDERMAL | Status: DC | PRN
  Administered 2018-12-14: 1.5 mg via TRANSDERMAL
  Filled 2018-12-14: qty 1

## 2018-12-14 MED ORDER — INSULIN ASPART 100 UNIT/ML ~~LOC~~ SOLN
0.0000 [IU] | Freq: Three times a day (TID) | SUBCUTANEOUS | Status: DC
  Administered 2018-12-14 – 2018-12-16 (×2): 1 [IU] via SUBCUTANEOUS

## 2018-12-14 MED ORDER — PANTOPRAZOLE SODIUM 40 MG IV SOLR
40.0000 mg | Freq: Two times a day (BID) | INTRAVENOUS | Status: DC
  Administered 2018-12-14 – 2018-12-17 (×6): 40 mg via INTRAVENOUS
  Filled 2018-12-14 (×6): qty 40

## 2018-12-14 MED ORDER — PROMETHAZINE HCL 25 MG/ML IJ SOLN: 12.5000 mg | Freq: Four times a day (QID) | INTRAMUSCULAR | Status: DC | PRN

## 2018-12-14 NOTE — Progress Notes (Signed)
5 Days Post-Op   Subjective/Chief Complaint: Started throwing up yesterday.  Also had flatus.    Objective: Vital signs in last 24 hours: Temp:  [98.4 F (36.9 C)-99 F (37.2 C)] 98.9 F (37.2 C) (07/14 0509) Pulse Rate:  [70-88] 83 (07/14 0509) Resp:  [18] 18 (07/14 0509) BP: (119-149)/(81-92) 131/85 (07/14 0509) SpO2:  [96 %-97 %] 97 % (07/14 0509) Last BM Date: 12/13/18  Intake/Output from previous day: 07/13 0701 - 07/14 0700 In: -  Out: 645 [Urine:425; Drains:220] Intake/Output this shift: Total I/O In: 240 [P.O.:240] Out: 350 [Drains:350]  General appearance: alert and cooperative, looks a bit sweaty Resp:breathing comfortably Abd:  Soft, still sl distended.  Dressing c/d/i.   Incision/Wound: CDI drains serous   Lab Results:  Recent Labs    12/13/18 0327 12/14/18 0221  WBC 5.0 5.7  HGB 8.5* 8.8*  HCT 24.9* 26.0*  PLT 220 241   BMET Recent Labs    12/13/18 0327 12/14/18 0221  NA 136 136  K 3.6 3.6  CL 104 104  CO2 23 24  GLUCOSE 134* 126*  BUN 10 8  CREATININE 0.48* 0.50*  CALCIUM 8.3* 8.2*   PT/INR No results for input(s): LABPROT, INR in the last 72 hours. ABG No results for input(s): PHART, HCO3 in the last 72 hours.  Invalid input(s): PCO2, PO2  Studies/Results: No results found.  Anti-infectives: Anti-infectives (From admission, onward)   Start     Dose/Rate Route Frequency Ordered Stop   12/03/2018 2000  ceFAZolin (ANCEF) IVPB 2g/100 mL premix     2 g 200 mL/hr over 30 Minutes Intravenous Every 8 hours 12/21/2018 1712 12/06/2018 2116   12/22/2018 0615  ceFAZolin (ANCEF) IVPB 2g/100 mL premix     2 g 200 mL/hr over 30 Minutes Intravenous On call to O.R. 12/05/2018 9147 12/26/2018 1148   12/15/2018 0615  ceFAZolin (ANCEF) 2-4 GM/100ML-% IVPB    Note to Pharmacy: Lorne Skeens   : cabinet override      12/04/2018 0615 12/30/2018 0745      Assessment/Plan: s/p Procedure(s) with comments: LAPAROSCOPY DIAGNOSTIC (N/A) - EPIDURAL WHIPPLE PROCEDURE  WITH UMBILICAL HERNIA REPAIR (N/A) Go back to NPO and hold tube feeds.   Ambulate. Await path for cholangioca.  protonix to BID IV and add maalox Add antiemetics.      LOS: 5 days    Stark Klein 12/14/2018

## 2018-12-14 NOTE — Progress Notes (Signed)
Physical Therapy Treatment Patient Details Name: Joseph Massey MRN: [id number] DOB: 09-13-1950 Today's Date: 12/14/2018    History of Present Illness Patient is a 68 y/o male who presents with pancreatic mass s/p whipple procedure with umbilical hernia repair 7/9. PMH includes Rt THA, HTN, obesity.    PT Comments    Pt supine in bed on arrival sleeping, pt slow to wake and lethargic.  He has difficulty keeping eyes open and refused OOB activity at this time.  Pt is agreeable to supine LE therapeutic exercises.  Plan next session for progression of functional mobility to tolerance.  Will continue to recommend HHPT at this time as this presentation is not his normal.    Follow Up Recommendations  Home health PT;Supervision for mobility/OOB     Equipment Recommendations  None recommended by PT    Recommendations for Other Services       Precautions / Restrictions Precautions Precautions: Fall Restrictions Weight Bearing Restrictions: No    Mobility  Bed Mobility               General bed mobility comments: pt refused OOB and appears quite lethargic performed LE exercises based on patient presentation.  Transfers                    Ambulation/Gait                 Stairs             Wheelchair Mobility    Modified Rankin (Stroke Patients Only)       Balance                                            Cognition Arousal/Alertness: Lethargic Behavior During Therapy: Flat affect Overall Cognitive Status: Difficult to assess                                        Exercises General Exercises - Lower Extremity Ankle Circles/Pumps: AROM;Both;20 reps;Supine Quad Sets: AROM;Both;10 reps;Supine Heel Slides: AROM;Both;10 reps;Supine Hip ABduction/ADduction: AROM;Both;10 reps;Supine Straight Leg Raises: AROM;Both;10 reps;Supine    General Comments        Pertinent Vitals/Pain Pain Assessment:  0-10 Pain Score: 2  Pain Location: abdomen Pain Descriptors / Indicators: Sore;Operative site guarding Pain Intervention(s): Monitored during session;Repositioned    Home Living                      Prior Function            PT Goals (current goals can now be found in the care plan section) Acute Rehab PT Goals Patient Stated Goal: to get better and go home Potential to Achieve Goals: Good Progress towards PT goals: Progressing toward goals    Frequency    Min 3X/week      PT Plan Current plan remains appropriate    Co-evaluation              AM-PAC PT "6 Clicks" Mobility   Outcome Measure  Help needed turning from your back to your side while in a flat bed without using bedrails?: A Little Help needed moving from lying on your back to sitting on the side of a flat bed without using bedrails?: A Lot Help needed  moving to and from a bed to a chair (including a wheelchair)?: A Little Help needed standing up from a chair using your arms (e.g., wheelchair or bedside chair)?: A Little Help needed to walk in hospital room?: A Little Help needed climbing 3-5 steps with a railing? : A Little 6 Click Score: 17    End of Session   Activity Tolerance: Patient limited by lethargy Patient left: with call bell/phone within reach;in bed Nurse Communication: Mobility status(informed nusring of lethargic presentation and IV pump beeping.) PT Visit Diagnosis: Pain;Unsteadiness on feet (R26.81);Difficulty in walking, not elsewhere classified (R26.2) Pain - part of body: (abdomen)     Time: 0981-1914 PT Time Calculation (min) (ACUTE ONLY): 12 min  Charges:  $Therapeutic Exercise: 8-22 mins                     Governor Rooks, PTA Acute Rehabilitation Services Pager [phone] Office [phone]     Cortney Mckinney Eli Hose 12/14/2018, 3:50 PM

## 2018-12-14 NOTE — Anesthesia Post-op Follow-up Note (Signed)
  Anesthesia Pain Follow-up Note  Patient: MIKAELE STECHER  Day #: 6  Date of Follow-up: 12/14/2018 Time: 2:31 PM  Last Vitals:  Vitals:   12/14/18 0509 12/14/18 1411  BP: 131/85 (!) 148/83  Pulse: 83 88  Resp: 18 15  Temp: 37.2 C 37.2 C  SpO2: 97% 95%    Level of Consciousness: alert  Pain: none   Side Effects:None  Catheter Site Exam:clean  Epidural / Intrathecal (From admission, onward)   Start     Dose/Rate Route Frequency Ordered Stop   12/01/2018 1545  ropivacaine (PF) 2 mg/mL (0.2%) (NAROPIN) injection     8 mL/hr 8 mL/hr  Epidural Continuous 12/12/2018 1534         Plan: Continue current therapy of postop epidural at surgeon's request  Jawann Urbani S

## 2018-12-14 NOTE — Addendum Note (Signed)
Addendum  created 12/14/18 1432 by Myrtie Soman, MD   Clinical Note Signed

## 2018-12-15 LAB — GLUCOSE, CAPILLARY
Glucose-Capillary: 95 mg/dL (ref 70–99)
Glucose-Capillary: 98 mg/dL (ref 70–99)
Glucose-Capillary: 118 mg/dL — ABNORMAL HIGH (ref 70–99)
Glucose-Capillary: 117 mg/dL — ABNORMAL HIGH (ref 70–99)

## 2018-12-15 MED ORDER — OSMOLITE 1.5 CAL PO LIQD
1000.0000 mL | ORAL | Status: DC
  Administered 2018-12-15: 11:00:00 1000 mL
  Filled 2018-12-15: qty 1000

## 2018-12-15 MED ORDER — PIPERACILLIN-TAZOBACTAM 3.375 G IVPB
3.3750 g | Freq: Three times a day (TID) | INTRAVENOUS | Status: DC
  Administered 2018-12-15 – 2018-12-17 (×7): 3.375 g via INTRAVENOUS
  Filled 2018-12-15 (×7): qty 50

## 2018-12-15 NOTE — Progress Notes (Signed)
Anesthesiology Follow-up:  Awake and alert, good pain control with epidural Ropivacaine 0.2% at 8 cc/hr. No numbness in LEs. Had nausea earlier improved with scopolamine patch.  VS: T-37.2 BP-108/72 HR- 77 RR- 19 O2 Sat- 95% on RA  6 days S/P Whipple procedure for pancreatic Ca, good pain control with epidural Ropivacaine with supplemental IV dilaudid.  Will continue Epidural today at present rate.  Roberts Gaudy

## 2018-12-15 NOTE — Progress Notes (Signed)
6 Days Post-Op   Subjective/Chief Complaint: Nausea improved with scop patch.  Was able to sleep.    Objective: Vital signs in last 24 hours: Temp:  [98.9 F (37.2 C)-99 F (37.2 C)] 99 F (37.2 C) (07/15 0552) Pulse Rate:  [88-93] 90 (07/15 0552) Resp:  [15-19] 19 (07/15 0552) BP: (114-148)/(71-83) 114/71 (07/15 0552) SpO2:  [90 %-95 %] 90 % (07/15 0552) Weight:  [98.6 kg] 98.6 kg (07/15 0500) Last BM Date: 12/14/18  Intake/Output from previous day: 07/14 0701 - 07/15 0700 In: 2937.2 [P.O.:420; I.V.:1978] Out: 2350 [Urine:300; Drains:2050] Intake/Output this shift: Total I/O In: -  Out: 775 [Drains:775]  General appearance: alert and cooperative, looks a bit sweaty  Resp:breathing comfortably Abd:  Soft, still sl distended.  Dressing c/d/i.   Incision/Wound: lateral drain is bilious.    Lab Results:  Recent Labs    12/13/18 0327 12/14/18 0221  WBC 5.0 5.7  HGB 8.5* 8.8*  HCT 24.9* 26.0*  PLT 220 241   BMET Recent Labs    12/13/18 0327 12/14/18 0221  NA 136 136  K 3.6 3.6  CL 104 104  CO2 23 24  GLUCOSE 134* 126*  BUN 10 8  CREATININE 0.48* 0.50*  CALCIUM 8.3* 8.2*   PT/INR No results for input(s): LABPROT, INR in the last 72 hours. ABG No results for input(s): PHART, HCO3 in the last 72 hours.  Invalid input(s): PCO2, PO2  Studies/Results: No results found.  Anti-infectives: Anti-infectives (From admission, onward)   Start     Dose/Rate Route Frequency Ordered Stop   12/15/18 0930  piperacillin-tazobactam (ZOSYN) IVPB 3.375 g     3.375 g 12.5 mL/hr over 240 Minutes Intravenous Every 8 hours 12/15/18 0923     12/15/2018 2000  ceFAZolin (ANCEF) IVPB 2g/100 mL premix     2 g 200 mL/hr over 30 Minutes Intravenous Every 8 hours 12/16/2018 1712 12/23/2018 2116   12/16/2018 0615  ceFAZolin (ANCEF) IVPB 2g/100 mL premix     2 g 200 mL/hr over 30 Minutes Intravenous On call to O.R. 12/16/2018 3419 12/30/2018 1148   12/23/2018 0615  ceFAZolin (ANCEF) 2-4  GM/100ML-% IVPB    Note to Pharmacy: Lorne Skeens   : cabinet override      12/25/2018 0615 12/08/2018 0745      Assessment/Plan: s/p Procedure(s) with comments: LAPAROSCOPY DIAGNOSTIC (N/A) - EPIDURAL WHIPPLE PROCEDURE WITH UMBILICAL HERNIA REPAIR (N/A) Antibiotics today for presumed panc leak Clears as tolerated, restart feeds.   Ambulate. Path pT2N2, pancreatic cancer rather than cholangiocarcinoma.    protonix to BID IV and maalox      LOS: 6 days    Stark Klein 12/15/2018

## 2018-12-15 NOTE — Addendum Note (Signed)
Addendum  created 12/15/18 1555 by Roberts Gaudy, MD   Clinical Note Signed

## 2018-12-15 NOTE — Progress Notes (Signed)
Pharmacy Antibiotic Note  Joseph Massey is a 68 y.o. male admitted on 12/16/2018 with presumed intra-abdominal infection.  Pharmacy has been consulted for Zosyn dosing.  Plan: Zosyn 3.375g IV q8h (4 hour infusion).  Monitor clinical course, renal function, and cultures/sensitivities.   Height: 5\' 10"  (177.8 cm) Weight: 217 lb 6 oz (98.6 kg) IBW/kg (Calculated) : 73  Temp (24hrs), Avg:98.9 F (37.2 C), Min:98.9 F (37.2 C), Max:99 F (37.2 C)  Recent Labs  Lab 12/10/18 0534 12/11/18 0548 12/12/18 0726 12/13/18 0327 12/14/18 0221  WBC 8.8 9.3 3.9* 5.0 5.7  CREATININE 0.63 0.64 0.53* 0.48* 0.50*    Estimated Creatinine Clearance: 102.6 mL/min (A) (by C-G formula based on SCr of 0.5 mg/dL (L)).    Allergies  Allergen Reactions  . Statins Other (See Comments)    Muscle cramps  . Vytorin [Ezetimibe-Simvastatin] Other (See Comments)    Muscle cramps    Antimicrobials this admission: Zosyn 7/15 >>     Thank you for allowing pharmacy to be a part of this patient's care.  Cristela Felt, PharmD PGY1 Pharmacy Resident [employer]: [phone]   12/15/2018 9:28 AM

## 2018-12-16 ENCOUNTER — Inpatient Hospital Stay (HOSPITAL_COMMUNITY): Payer: Medicare Other

## 2018-12-16 LAB — TYPE AND SCREEN
ABO/RH(D): O POS
Antibody Screen: NEGATIVE
Unit division: 0
Unit division: 0
Unit division: 0
Unit division: 0

## 2018-12-16 LAB — BASIC METABOLIC PANEL
Anion gap: 10 (ref 5–15)
BUN: 8 mg/dL (ref 8–23)
CO2: 24 mmol/L (ref 22–32)
Calcium: 8 mg/dL — ABNORMAL LOW (ref 8.9–10.3)
Chloride: 101 mmol/L (ref 98–111)
Creatinine, Ser: 0.77 mg/dL (ref 0.61–1.24)
GFR calc Af Amer: >60 mL/min (ref >60)
GFR calc non Af Amer: >60 mL/min (ref >60)
Glucose, Bld: 126 mg/dL — ABNORMAL HIGH (ref 70–99)
Potassium: 3.4 mmol/L — ABNORMAL LOW (ref 3.5–5.1)
Sodium: 135 mmol/L (ref 135–145)

## 2018-12-16 LAB — GLUCOSE, CAPILLARY
Glucose-Capillary: 110 mg/dL — ABNORMAL HIGH (ref 70–99)
Glucose-Capillary: 115 mg/dL — ABNORMAL HIGH (ref 70–99)
Glucose-Capillary: 123 mg/dL — ABNORMAL HIGH (ref 70–99)

## 2018-12-16 LAB — BPAM RBC
Blood Product Expiration Date: 202008072359
Blood Product Expiration Date: 202008072359
Blood Product Expiration Date: 202008072359
Blood Product Expiration Date: 202008072359
ISSUE DATE / TIME: 202007091052
ISSUE DATE / TIME: 202007091052
Unit Type and Rh: 5100
Unit Type and Rh: 5100
Unit Type and Rh: 5100
Unit Type and Rh: 5100

## 2018-12-16 MED ORDER — FUROSEMIDE 10 MG/ML IJ SOLN
20.0000 mg | Freq: Once | INTRAMUSCULAR | Status: AC
  Administered 2018-12-16: 15:00:00 20 mg via INTRAVENOUS
  Filled 2018-12-16: qty 2

## 2018-12-16 MED ORDER — ALBUTEROL SULFATE (2.5 MG/3ML) 0.083% IN NEBU
2.5000 mg | INHALATION_SOLUTION | Freq: Four times a day (QID) | RESPIRATORY_TRACT | Status: DC | PRN
  Administered 2018-12-16 – 2018-12-17 (×4): 2.5 mg via RESPIRATORY_TRACT
  Filled 2018-12-16 (×4): qty 3

## 2018-12-16 MED ORDER — OSMOLITE 1.5 CAL PO LIQD
1000.0000 mL | ORAL | Status: DC
  Administered 2018-12-16: 1000 mL
  Filled 2018-12-16: qty 1000

## 2018-12-16 MED ORDER — METOCLOPRAMIDE HCL 5 MG/ML IJ SOLN: 10.0000 mg | Freq: Three times a day (TID) | INTRAMUSCULAR | Status: DC | PRN

## 2018-12-16 MED ORDER — OXYCODONE HCL 5 MG PO TABS
5.0000 mg | ORAL_TABLET | ORAL | Status: DC | PRN
  Administered 2018-12-17: 10 mg via ORAL
  Filled 2018-12-16 (×2): qty 2

## 2018-12-16 MED ORDER — POTASSIUM CHLORIDE CRYS ER 20 MEQ PO TBCR
40.0000 meq | EXTENDED_RELEASE_TABLET | Freq: Every day | ORAL | Status: DC
  Filled 2018-12-16 (×2): qty 2

## 2018-12-16 MED ORDER — HYDROMORPHONE HCL 1 MG/ML IJ SOLN
0.5000 mg | INTRAMUSCULAR | Status: DC | PRN
  Administered 2018-12-16: 19:00:00 2 mg via INTRAVENOUS
  Administered 2018-12-16 – 2018-12-17 (×3): 1 mg via INTRAVENOUS
  Filled 2018-12-16 (×2): qty 1
  Filled 2018-12-16: qty 2
  Filled 2018-12-16 (×2): qty 1

## 2018-12-16 NOTE — Progress Notes (Signed)
7 Days Post-Op   Subjective/Chief Complaint: Nausea significantly improved.  Has some issues with drain output being high and popping off the cap.  Also continues to have hoarseness and has to lean forward or back to not feel like he is aspirating and is having wheezing.   Additionally he is having issues feeling like he can't urinate fully.    Objective: Vital signs in last 24 hours: Temp:  [98.7 F (37.1 C)-99.7 F (37.6 C)] 99.7 F (37.6 C) (07/16 0546) Pulse Rate:  [77-86] 78 (07/16 0546) Resp:  [18-19] 18 (07/16 0546) BP: (108-127)/(67-74) 116/67 (07/16 0546) SpO2:  [94 %-98 %] 94 % (07/16 0546) Weight:  [98 kg] 98 kg (07/16 0500) Last BM Date: 12/15/18  Intake/Output from previous day: 07/15 0701 - 07/16 0700 In: 2575 [P.O.:260; I.V.:1875.2; NG/GT:150; IV Piggyback:100.3] Out: 1900 [Urine:200; Drains:1700] Intake/Output this shift: Total I/O In: -  Out: 75 [Drains:75]  General appearance: alert and cooperative, looks much better.  Still quite hoarse.   Resp:breathing comfortably Abd:  Soft, less distended.  Incision/Wound: lateral drain is bilious with high volume.    Lab Results:  Recent Labs    12/14/18 0221  WBC 5.7  HGB 8.8*  HCT 26.0*  PLT 241   BMET Recent Labs    12/14/18 0221 12/16/18 1019  NA 136 135  K 3.6 3.4*  CL 104 101  CO2 24 24  GLUCOSE 126* 126*  BUN 8 8  CREATININE 0.50* 0.77  CALCIUM 8.2* 8.0*   PT/INR No results for input(s): LABPROT, INR in the last 72 hours. ABG No results for input(s): PHART, HCO3 in the last 72 hours.  Invalid input(s): PCO2, PO2  Studies/Results: No results found.  Anti-infectives: Anti-infectives (From admission, onward)   Start     Dose/Rate Route Frequency Ordered Stop   12/15/18 0930  piperacillin-tazobactam (ZOSYN) IVPB 3.375 g     3.375 g 12.5 mL/hr over 240 Minutes Intravenous Every 8 hours 12/15/18 0923     12/03/2018 2000  ceFAZolin (ANCEF) IVPB 2g/100 mL premix     2 g 200 mL/hr over 30  Minutes Intravenous Every 8 hours 12/16/2018 1712 12/16/2018 2116   12/14/2018 0615  ceFAZolin (ANCEF) IVPB 2g/100 mL premix     2 g 200 mL/hr over 30 Minutes Intravenous On call to O.R. 12/23/2018 6389 12/07/2018 1148   12/18/2018 0615  ceFAZolin (ANCEF) 2-4 GM/100ML-% IVPB    Note to Pharmacy: Lorne Skeens   : cabinet override      12/28/2018 0615 12/10/2018 0745      Assessment/Plan: s/p Procedure(s) with comments: LAPAROSCOPY DIAGNOSTIC (N/A) - EPIDURAL WHIPPLE PROCEDURE WITH UMBILICAL HERNIA REPAIR (N/A)  Antibiotics today for presumed panc leak Advance feeds and diet to fulls Speech eval for sense of possible aspiration CXR to eval for wheezing.  Looks like some congestion.  Will do 1 dose lasix and albuterol nebs KVO IVF Potassium for hypokalemia Check post void residuals.    Ambulate. 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   Protonix to BID IV and maalox  Will likely d/c epidural today or tomorrow.        LOS: 7 days    Stark Klein 12/16/2018

## 2018-12-16 NOTE — Progress Notes (Signed)
[place] with patient's wife regarding her concern of patient not being monitored closely enough. I personally assessed patient, c/o acid reflux and requesting breathing treatment. Administered Zofran and breathing treatment. Also confirmed with patient I would contact Dr. Barry Dienes to address his concerns.   54 Spoke with Dr. Barry Dienes, received PRN Reglan IV order.  1640 Returned wife's phone call to provide an update.

## 2018-12-16 NOTE — Progress Notes (Signed)
Physical Therapy Treatment Patient Details Name: Joseph Massey MRN: [id number] DOB: Oct 29, 1950 Today's Date: 12/16/2018    History of Present Illness Patient is a 68 y/o male who presents with pancreatic mass s/p whipple procedure with umbilical hernia repair 7/9. PMH includes Rt THA, HTN, obesity.    PT Comments    Pt performed gt training and functional mobility with supervision / min guard.  Pt guarded but progressing well.  Plan for HHPT remains appropriate.  Plan for progression of mobility next session.    Follow Up Recommendations  Home health PT;Supervision for mobility/OOB     Equipment Recommendations  None recommended by PT    Recommendations for Other Services       Precautions / Restrictions Precautions Precautions: Fall Precaution Comments: PCA pump; 2 drains; NG tube to suction Restrictions Weight Bearing Restrictions: No    Mobility  Bed Mobility Overal bed mobility: Needs Assistance Bed Mobility: Rolling;Sidelying to Sit Rolling: Supervision Sidelying to sit: Supervision       General bed mobility comments: Pt able to move edge of bed without assistance and utilized logrolling without cueing  Transfers Overall transfer level: Needs assistance Equipment used: Rolling walker (2 wheeled) Transfers: Sit to/from Stand Sit to Stand: Supervision         General transfer comment: Cues for safety with RW and hand placement  Ambulation/Gait Ambulation/Gait assistance: Min guard Gait Distance (Feet): 350 Feet Assistive device: Rolling walker (2 wheeled) Gait Pattern/deviations: Step-through pattern;Trunk flexed     General Gait Details: No LOB noted and good use of RW.  Pt is progressing well.   Stairs             Wheelchair Mobility    Modified Rankin (Stroke Patients Only)       Balance Overall balance assessment: Needs assistance Sitting-balance support: Feet supported;No upper extremity supported Sitting balance-Leahy Scale:  Good       Standing balance-Leahy Scale: Fair Standing balance comment: Able to stand statically without UE support but does better with UE support for dynamic tasks.                            Cognition Arousal/Alertness: Awake/alert Behavior During Therapy: Flat affect;WFL for tasks assessed/performed Overall Cognitive Status: Within Functional Limits for tasks assessed                                 General Comments: for basic mobility tasks.      Exercises      General Comments        Pertinent Vitals/Pain Pain Assessment: Faces Pain Score: 5  Pain Location: abdomen Pain Descriptors / Indicators: Sore;Operative site guarding Pain Intervention(s): Monitored during session;Repositioned    Home Living                      Prior Function            PT Goals (current goals can now be found in the care plan section) Acute Rehab PT Goals Patient Stated Goal: to get better and go home Potential to Achieve Goals: Good Progress towards PT goals: Progressing toward goals    Frequency    Min 3X/week      PT Plan Current plan remains appropriate    Co-evaluation              AM-PAC PT "6 Clicks" Mobility  Outcome Measure  Help needed turning from your back to your side while in a flat bed without using bedrails?: A Little Help needed moving from lying on your back to sitting on the side of a flat bed without using bedrails?: A Lot Help needed moving to and from a bed to a chair (including a wheelchair)?: A Little Help needed standing up from a chair using your arms (e.g., wheelchair or bedside chair)?: A Little Help needed to walk in hospital room?: A Little Help needed climbing 3-5 steps with a railing? : A Little 6 Click Score: 17    End of Session Equipment Utilized During Treatment: Gait belt Activity Tolerance: Patient limited by lethargy Patient left: with call bell/phone within reach;in bed Nurse  Communication: Mobility status PT Visit Diagnosis: Pain;Unsteadiness on feet (R26.81);Difficulty in walking, not elsewhere classified (R26.2)     Time: 7654-6503 PT Time Calculation (min) (ACUTE ONLY): 17 min  Charges:  $Gait Training: 8-22 mins                     Governor Rooks, PTA Acute Rehabilitation Services Pager [phone] Office [phone]     Lenisha Lacap Eli Hose 12/16/2018, 5:52 PM

## 2018-12-16 NOTE — Anesthesia Post-op Follow-up Note (Signed)
  Anesthesia Pain Follow-up Note  Patient: Joseph Massey  Day #: 7  Date of Follow-up: 12/16/2018 Time: 8:38 AM  Last Vitals:  Vitals:   12/15/18 2326 12/16/18 0546  BP:  116/67  Pulse:  78  Resp: 18 18  Temp:  37.6 C  SpO2: 97% 94%    Level of Consciousness: alert  Pain: mild   Side Effects:None  Catheter Site Exam:clean, dry  Epidural / Intrathecal (From admission, onward)   Start     Dose/Rate Route Frequency Ordered Stop   12/15/2018 1545  ropivacaine (PF) 2 mg/mL (0.2%) (NAROPIN) injection     8 mL/hr 8 mL/hr  Epidural Continuous 12/08/2018 1534         Plan: Continue current therapy of postop epidural at surgeon's request Probably D/C tomorrow  [facility] DANIEL

## 2018-12-16 NOTE — Care Management Important Message (Signed)
Important Message  Patient Details  Name: Joseph Massey MRN: [id number] Date of Birth: January 22, 1951   Medicare Important Message Given:  Yes     Memory Argue 12/16/2018, 3:44 PM

## 2018-12-16 NOTE — TOC Initial Note (Signed)
Transition of Care [facility]) - Initial/Assessment Note    Patient Details  Name: Joseph Massey MRN: [id number] Date of Birth: 1950-10-17  [facility] CM/SW Contact:    Marilu Favre, RN Phone Number: 12/16/2018, 3:26 PM  Clinical Narrative:                 Patient from home with wife. Confirmed face sheet information.   Patient has J tube for tube feeds ( not at goal), 2 drains .   Will continue to follow for discharge needs. Will need tube feeds await recommendations for home, nursing care needed, and orders.  Will need HHRN and HHPT.   Patient has no preference in home health agency.   Expected Discharge Plan: [place] Barriers to Discharge: Continued Medical Work up   Patient Goals and CMS Choice Patient states their goals for this hospitalization and ongoing recovery are:: to go home CMS Medicare.gov Compare Post Acute Care list provided to:: Patient Choice offered to / list presented to : Patient  Expected Discharge Plan and Services Expected Discharge Plan: [place] Choice: Home Health, Durable Medical Equipment Living arrangements for the past 2 months: Single Family Home                 DME Arranged: Tube feeding         HH Arranged: RN, PT          Prior Living Arrangements/Services Living arrangements for the past 2 months: Single Family Home Lives with:: Spouse Patient language and need for interpreter reviewed:: Yes Do you feel safe going back to the place where you live?: Yes      Need for Family Participation in Patient Care: Yes (Comment) Care giver support system in place?: Yes (comment)   Criminal Activity/Legal Involvement Pertinent to Current Situation/Hospitalization: No - Comment as needed  Activities of Daily Living Home Assistive Devices/Equipment: Cane (specify quad or straight) ADL Screening (condition at time of admission) Patient's cognitive ability  adequate to safely complete daily activities?: Yes Is the patient deaf or have difficulty hearing?: No Does the patient have difficulty seeing, even when wearing glasses/contacts?: No Does the patient have difficulty concentrating, remembering, or making decisions?: No Patient able to express need for assistance with ADLs?: Yes Does the patient have difficulty dressing or bathing?: No Independently performs ADLs?: Yes (appropriate for developmental age) Does the patient have difficulty walking or climbing stairs?: Yes Weakness of Legs: Both Weakness of Arms/Hands: None  Permission Sought/Granted   Permission granted to share information with : Yes, Verbal Permission Granted              Emotional Assessment Appearance:: Appears stated age Attitude/Demeanor/Rapport: Engaged Affect (typically observed): Accepting Orientation: : Oriented to Self, Oriented to Place, Oriented to  Time, Oriented to Situation Alcohol / Substance Use: Not Applicable Psych Involvement: No (comment)  Admission diagnosis:  Pancreatic cancer [facility]) [C25.9] Patient Active Problem List   Diagnosis Date Noted  . Pancreatic cancer ([place]) 12/02/2018  . 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 Preoperative cardiovascular examination 08/20/2018  . Mixed hyperlipidemia 08/20/2018  . Family history of hypertrophic cardiomyopathy 08/20/2018  . Recurrent urticaria 02/22/2018  . Gastritis 02/22/2018  . Allergic reaction 02/22/2018  . Seasonal and perennial allergic rhinitis 02/22/2018  . Primary osteoarthritis of right hip 06/22/2017  . 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 Primary osteoarthritis of right knee 06/22/2017  . Gout  06/22/2017  . Essential hypertension 06/22/2017   PCP:  Vernie Shanks, MD Pharmacy:   [id number] - [place], [place] [address] Phone: [phone] Fax: [phone]  [place], [place] [facility] [address] [place] [address] [place] [address] Phone: [phone] Fax: [phone]     Social Determinants of Health (SDOH) Interventions    Readmission Risk Interventions No flowsheet data found.

## 2018-12-17 ENCOUNTER — Inpatient Hospital Stay (HOSPITAL_COMMUNITY): Payer: Medicare Other

## 2018-12-17 LAB — CBC
HCT: 25.7 % — ABNORMAL LOW (ref 39.0–52.0)
HCT: 21.4 % — ABNORMAL LOW (ref 39.0–52.0)
Hemoglobin: 8.7 g/dL — ABNORMAL LOW (ref 13.0–17.0)
Hemoglobin: 7.1 g/dL — ABNORMAL LOW (ref 13.0–17.0)
MCH: 31.8 pg (ref 26.0–34.0)
MCH: 32.9 pg (ref 26.0–34.0)
MCHC: 33.9 g/dL (ref 30.0–36.0)
MCHC: 33.2 g/dL (ref 30.0–36.0)
MCV: 93.8 fL (ref 80.0–100.0)
MCV: 99.1 fL (ref 80.0–100.0)
Platelets: 258 10*3/uL (ref 150–400)
Platelets: 127 10*3/uL — ABNORMAL LOW (ref 150–400)
RBC: 2.74 MIL/uL — ABNORMAL LOW (ref 4.22–5.81)
RBC: 2.16 MIL/uL — ABNORMAL LOW (ref 4.22–5.81)
RDW: 13.7 % (ref 11.5–15.5)
RDW: 14.2 % (ref 11.5–15.5)
WBC: 10.3 10*3/uL (ref 4.0–10.5)
WBC: 5.4 10*3/uL (ref 4.0–10.5)
nRBC: 0 % (ref 0.0–0.2)
nRBC: 1.5 % — ABNORMAL HIGH (ref 0.0–0.2)

## 2018-12-17 LAB — BASIC METABOLIC PANEL
Anion gap: 15 (ref 5–15)
BUN: 10 mg/dL (ref 8–23)
CO2: 21 mmol/L — ABNORMAL LOW (ref 22–32)
Calcium: 7.7 mg/dL — ABNORMAL LOW (ref 8.9–10.3)
Chloride: 103 mmol/L (ref 98–111)
Creatinine, Ser: 1.06 mg/dL (ref 0.61–1.24)
GFR calc Af Amer: >60 mL/min (ref >60)
GFR calc non Af Amer: >60 mL/min (ref >60)
Glucose, Bld: 189 mg/dL — ABNORMAL HIGH (ref 70–99)
Potassium: 4.2 mmol/L (ref 3.5–5.1)
Sodium: 139 mmol/L (ref 135–145)

## 2018-12-17 LAB — COMPREHENSIVE METABOLIC PANEL
ALT: 33 U/L (ref 0–44)
AST: 18 U/L (ref 15–41)
Albumin: 2.4 g/dL — ABNORMAL LOW (ref 3.5–5.0)
Alkaline Phosphatase: 79 U/L (ref 38–126)
Anion gap: 10 (ref 5–15)
BUN: 8 mg/dL (ref 8–23)
CO2: 23 mmol/L (ref 22–32)
Calcium: 7.9 mg/dL — ABNORMAL LOW (ref 8.9–10.3)
Chloride: 102 mmol/L (ref 98–111)
Creatinine, Ser: 0.66 mg/dL (ref 0.61–1.24)
GFR calc Af Amer: >60 mL/min (ref >60)
GFR calc non Af Amer: >60 mL/min (ref >60)
Glucose, Bld: 149 mg/dL — ABNORMAL HIGH (ref 70–99)
Potassium: 3.4 mmol/L — ABNORMAL LOW (ref 3.5–5.1)
Sodium: 135 mmol/L (ref 135–145)
Total Bilirubin: 1 mg/dL (ref 0.3–1.2)
Total Protein: 5.8 g/dL — ABNORMAL LOW (ref 6.5–8.1)

## 2018-12-17 LAB — PREPARE RBC (CROSSMATCH)

## 2018-12-17 LAB — GLUCOSE, CAPILLARY: Glucose-Capillary: 141 mg/dL — ABNORMAL HIGH (ref 70–99)

## 2018-12-17 MED ORDER — EPINEPHRINE 1 MG/10ML IJ SOSY
PREFILLED_SYRINGE | INTRAMUSCULAR | Status: AC
  Filled 2018-12-17: qty 20

## 2018-12-17 MED ORDER — EPINEPHRINE 1 MG/10ML IJ SOSY
PREFILLED_SYRINGE | INTRAMUSCULAR | Status: AC
  Filled 2018-12-17: qty 10

## 2018-12-17 MED ORDER — SODIUM CHLORIDE 0.9% IV SOLUTION: Freq: Once | INTRAVENOUS | Status: DC

## 2018-12-17 MED ORDER — TAMSULOSIN HCL 0.4 MG PO CAPS: 0.4000 mg | ORAL_CAPSULE | Freq: Every day | ORAL | Status: DC

## 2018-12-17 MED ORDER — OSMOLITE 1.5 CAL PO LIQD
1000.0000 mL | ORAL | Status: DC
  Filled 2018-12-17: qty 1000

## 2018-12-17 MED FILL — Medication: Qty: 1 | Status: AC

## 2018-12-18 LAB — BPAM RBC
Blood Product Expiration Date: 202008152359
Blood Product Expiration Date: 202008152359
ISSUE DATE / TIME: 202007171611
ISSUE DATE / TIME: 202007171611
Unit Type and Rh: 5100
Unit Type and Rh: 5100

## 2018-12-18 LAB — TYPE AND SCREEN
ABO/RH(D): O POS
Antibody Screen: NEGATIVE
Unit division: 0
Unit division: 0

## 2019-01-01 NOTE — Progress Notes (Signed)
Modified Barium Swallow Progress Note  Patient Details  Name: Joseph Massey MRN: [id number] Date of Birth: 12/16/1950  Today's Date: 2019-01-12  Modified Barium Swallow completed.  Full report located under Chart Review in the Imaging Section.  Brief recommendations include the following:  Clinical Impression  Patient presents with a mild oral and a mild-moderate pharyngeal dysphagia without aspiration or penetration even with large cup sips and successive straw sips of thin liquids. Oral phase was marked by mild decreased mastication and oral transit and patient declined barium tablet, feeling that he would not be able to swallow. He exhibited swallow initiation delays to level of vallecular sinus with all tested boluses and to level of pyriform sinus with straw sips of thin liquids, piecemeal swallowing of regular solids, puree solids and thin liquids, with boluses spilling into vallecular sinus prior to swallow initiated during initial and subsequent swallows. Patient did exhibit an instance of coughing when drinking plain water (fluoro was off). Small amount of boluses remained in pyriform sinus post swallows but no retrograde movement of boluses was observed. Minimal decrease in cricopharyngeal relaxation noted, but did signficantly impact swallow function during this study.    Swallow Evaluation Recommendations   Recommended Consults: Consider GI evaluation   SLP Diet Recommendations: Dysphagia 3 (Mech soft) solids;Thin liquid   Liquid Administration via: Cup;Straw   Medication Administration: Whole meds with liquid   Supervision: Patient able to self feed;Intermittent supervision to cue for compensatory strategies   Compensations: Minimize environmental distractions;Slow rate;Small sips/bites   Postural Changes: Seated upright at 90 degrees;Remain semi-upright after after feeds/meals (Comment)   Oral Care Recommendations: Oral care BID        Joseph Mode  Massey 2019-01-12,5:34 PM   Sonia Baller, MA, CCC-SLP Speech Therapy [facility] Acute Rehab Pager: [phone]

## 2019-01-01 NOTE — Progress Notes (Signed)
8 Days Post-Op   Subjective/Chief Complaint: Feeling much better today.  Reflux more manageable.      Objective: Vital signs in last 24 hours: Temp:  [98.5 F (36.9 C)-99 F (37.2 C)] 98.6 F (37 C) (07/17 0508) Pulse Rate:  [79-81] 79 (07/17 0508) Resp:  [16-20] 16 (07/17 0508) BP: (117-122)/(75-85) 120/76 (07/17 0508) SpO2:  [93 %-96 %] 94 % (07/17 0508) Last BM Date: 12/15/18  Intake/Output from previous day: 07/16 0701 - 07/17 0700 In: 980 [P.O.:980] Out: 1175 [Urine:475; Drains:700] Intake/Output this shift: Total I/O In: 240 [P.O.:240] Out: 1300 [Drains:1300]  General appearance: alert and cooperative, looks much better.  Still quite hoarse.   Resp:breathing comfortably Abd:  Soft, less distended.  Incision/Wound: lateral drain is bilious with high volume.    Lab Results:  Recent Labs    12-27-2018 0153  WBC 10.3  HGB 8.7*  HCT 25.7*  PLT 258   BMET Recent Labs    12/16/18 1019 12/27/2018 0153  NA 135 135  K 3.4* 3.4*  CL 101 102  CO2 24 23  GLUCOSE 126* 149*  BUN 8 8  CREATININE 0.77 0.66  CALCIUM 8.0* 7.9*   PT/INR No results for input(s): LABPROT, INR in the last 72 hours. ABG No results for input(s): PHART, HCO3 in the last 72 hours.  Invalid input(s): PCO2, PO2  Studies/Results: Dg Chest Port 1 View  Result Date: 12/16/2018 CLINICAL DATA:  Increased shortness of breath. EXAM: PORTABLE CHEST 1 VIEW COMPARISON:  CT scan November 08, 2018. FINDINGS: Cardiomediastinal silhouette is unremarkable given technique. Opacities are seen in the bases with some streaky/platelike come opponents. The lungs are otherwise clear. No other abnormalities. IMPRESSION: 1. Bibasilar opacities may represent atelectasis or developing infiltrate which could include pneumonia or aspiration. Recommend clinical correlation and follow-up to resolution. Electronically Signed   By: Dorise Bullion III M.D   On: 12/16/2018 14:52    Anti-infectives: Anti-infectives (From admission,  onward)   Start     Dose/Rate Route Frequency Ordered Stop   12/15/18 0930  piperacillin-tazobactam (ZOSYN) IVPB 3.375 g     3.375 g 12.5 mL/hr over 240 Minutes Intravenous Every 8 hours 12/15/18 0923     12/26/2018 2000  ceFAZolin (ANCEF) IVPB 2g/100 mL premix     2 g 200 mL/hr over 30 Minutes Intravenous Every 8 hours 12/31/2018 1712 12/16/2018 2116   12/11/2018 0615  ceFAZolin (ANCEF) IVPB 2g/100 mL premix     2 g 200 mL/hr over 30 Minutes Intravenous On call to O.R. 12/10/2018 8676 12/11/2018 1148   12/07/2018 0615  ceFAZolin (ANCEF) 2-4 GM/100ML-% IVPB    Note to Pharmacy: Lorne Skeens   : cabinet override      12/30/2018 0615 12/08/2018 0745      Assessment/Plan: s/p Procedure(s) with comments: LAPAROSCOPY DIAGNOSTIC (N/A) - EPIDURAL WHIPPLE PROCEDURE WITH UMBILICAL HERNIA REPAIR (N/A)  Antibiotics for presumed panc leak Advance feeds and diet  Speech eval demonstrated poor vocal cord function CXR c/w possible aspiration.   KVO IVF Potassium for hypokalemia Check post void residuals.    Ambulate. 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   Protonix BID IV, reglan and maalox for reflux  Will likely d/c epidural today or tomorrow.        LOS: 8 days    Stark Klein 2018-12-27

## 2019-01-01 NOTE — Procedures (Signed)
Intubation Procedure Note KEIMARI Ashley [id number] [birth year]  Procedure: Intubation Indications: Respiratory insufficiency  Procedure Details Consent: Unable to obtain consent because of emergent medical necessity. Time Out: Verified patient identification, verified procedure, site/side was marked, verified correct patient position, special equipment/implants available, medications/allergies/relevent history reviewed, required imaging and test results available.  Performed  Maximum sterile technique was used including cap, gloves, gown, hand hygiene and mask.  MAC and 4    Evaluation Hemodynamic Status: unstable O2 sats: unstable Patient's Current Condition: unstable Complications: No apparent complications Patient did tolerate procedure well. Chest X-ray ordered to verify placement.  CXR: pending.   Dimple Nanas 2019/01/10

## 2019-01-01 NOTE — Consult Note (Signed)
Reason for Consult: Voice trouble Referring Physician: Stark Klein, MD  Joseph Massey is an 68 y.o. male.  HPI: About 8 days post Whipple procedure for pancreatic cancer.  His voice was normal before the procedure but ever since the surgery he has had a very weak voice and has had difficulty drinking liquids.  He is in the process of undergoing swallowing evaluation and has imaging scheduled in the near future.  No prior history of lung problems.  Never smoker.  Past Medical History:  Diagnosis Date  . Arthritis   . BPH (benign prostatic hyperplasia)   . Cancer ([place])   . ED (erectile dysfunction)   . Gout   . History of kidney stones   . Hyperlipidemia   . Hypertension   . Jaundice LAST 2 WEEKS  . Obesity   . Recurrent upper respiratory infection (URI)   . Recurrent urticaria 02/22/2018  . Sleep apnea    cpap  . UC (ulcerative colitis) ([place])   . Umbilical hernia     Past Surgical History:  Procedure Laterality Date  . BILIARY BRUSHING  11/02/2018   Procedure: BILIARY BRUSHING;  Surgeon: Clarene Essex, MD;  Location: [facility];  Service: Endoscopy;;  . BILIARY STENT PLACEMENT N/A 11/02/2018   Procedure: BILIARY STENT PLACEMENT;  Surgeon: Clarene Essex, MD;  Location: [facility];  Service: Endoscopy;  Laterality: N/A;  . COLONOSCOPY N/A 02/15/2013   Procedure: COLONOSCOPY;  Surgeon: Garlan Fair, MD;  Location: [facility];  Service: Endoscopy;  Laterality: N/A;  . dental implants    . ERCP N/A 11/02/2018   Procedure: ENDOSCOPIC RETROGRADE CHOLANGIOPANCREATOGRAPHY (ERCP);  Surgeon: Clarene Essex, MD;  Location: [facility];  Service: Endoscopy;  Laterality: N/A;  . ESOPHAGOGASTRODUODENOSCOPY (EGD) WITH PROPOFOL N/A 11/02/2018   Procedure: ESOPHAGOGASTRODUODENOSCOPY (EGD) WITH PROPOFOL;  Surgeon: Clarene Essex, MD;  Location: [facility];  Service: Endoscopy;  Laterality: N/A;  . EUS N/A 11/02/2018   Procedure: FULL UPPER ENDOSCOPIC ULTRASOUND (EUS) RADIAL;  Surgeon: Clarene Essex, MD;   Location: [facility];  Service: Endoscopy;  Laterality: N/A;  . LAPAROSCOPY N/A 12/05/2018   Procedure: LAPAROSCOPY DIAGNOSTIC;  Surgeon: Stark Klein, MD;  Location: [facility];  Service: General;  Laterality: N/A;  EPIDURAL  . PANCREATIC STENT PLACEMENT  11/02/2018   Procedure: PANCREATIC STENT PLACEMENT;  Surgeon: Clarene Essex, MD;  Location: [facility];  Service: Endoscopy;;  . SINOSCOPY    . SPHINCTEROTOMY  11/02/2018   Procedure: SPHINCTEROTOMY;  Surgeon: Clarene Essex, MD;  Location: [facility];  Service: Endoscopy;;  . TONSILLECTOMY    . TOTAL HIP ARTHROPLASTY Right 07/14/2017   Procedure: RIGHT TOTAL HIP ARTHROPLASTY ANTERIOR APPROACH;  Surgeon: Renette Butters, MD;  Location: [facility];  Service: Orthopedics;  Laterality: Right;  . WHIPPLE PROCEDURE N/A 12/05/2018   Procedure: WHIPPLE PROCEDURE WITH UMBILICAL HERNIA REPAIR;  Surgeon: Stark Klein, MD;  Location: [facility];  Service: General;  Laterality: N/A;    Family History  Problem Relation Age of Onset  . Heart attack Father   . Allergic rhinitis Neg Hx   . Asthma Neg Hx   . Eczema Neg Hx   . Urticaria Neg Hx     Social History:  reports that he has never smoked. He has never used smokeless tobacco. He reports previous alcohol use of about 2.0 standard drinks of alcohol per week. He reports that he does not use drugs.  Allergies:  Allergies  Allergen Reactions  . Statins Other (See Comments)    Muscle cramps  .  Vytorin [Ezetimibe-Simvastatin] Other (See Comments)    Muscle cramps    Medications: Reviewed  Results for orders placed or performed during the hospital encounter of 12/10/2018 (from the past 48 hour(s))  Glucose, capillary     Status: Abnormal   Collection Time: 12/15/18  4:32 PM  Result Value Ref Range   Glucose-Capillary 118 (H) 70 - 99 mg/dL  Glucose, capillary     Status: Abnormal   Collection Time: 12/15/18 10:40 PM  Result Value Ref Range   Glucose-Capillary 117 (H) 70 - 99 mg/dL   Comment 1 Notify RN     Comment 2 Document in Chart   Glucose, capillary     Status: Abnormal   Collection Time: 12/16/18  5:45 AM  Result Value Ref Range   Glucose-Capillary 110 (H) 70 - 99 mg/dL   Comment 1 Notify RN    Comment 2 Document in Chart   Basic metabolic panel     Status: Abnormal   Collection Time: 12/16/18 10:19 AM  Result Value Ref Range   Sodium 135 135 - 145 mmol/L   Potassium 3.4 (L) 3.5 - 5.1 mmol/L   Chloride 101 98 - 111 mmol/L   CO2 24 22 - 32 mmol/L   Glucose, Bld 126 (H) 70 - 99 mg/dL   BUN 8 8 - 23 mg/dL   Creatinine, Ser 0.77 0.61 - 1.24 mg/dL   Calcium 8.0 (L) 8.9 - 10.3 mg/dL   GFR calc non Af Amer >60 >60 mL/min   GFR calc Af Amer >60 >60 mL/min   Anion gap 10 5 - 15    Comment: Performed at [facility], [address] [place], [place] [address]  Glucose, capillary     Status: Abnormal   Collection Time: 12/16/18  2:29 PM  Result Value Ref Range   Glucose-Capillary 115 (H) 70 - 99 mg/dL  Glucose, capillary     Status: Abnormal   Collection Time: 12/16/18 10:13 PM  Result Value Ref Range   Glucose-Capillary 123 (H) 70 - 99 mg/dL  Comprehensive metabolic panel     Status: Abnormal   Collection Time: Jan 16, 2019  1:53 AM  Result Value Ref Range   Sodium 135 135 - 145 mmol/L   Potassium 3.4 (L) 3.5 - 5.1 mmol/L   Chloride 102 98 - 111 mmol/L   CO2 23 22 - 32 mmol/L   Glucose, Bld 149 (H) 70 - 99 mg/dL   BUN 8 8 - 23 mg/dL   Creatinine, Ser 0.66 0.61 - 1.24 mg/dL   Calcium 7.9 (L) 8.9 - 10.3 mg/dL   Total Protein 5.8 (L) 6.5 - 8.1 g/dL   Albumin 2.4 (L) 3.5 - 5.0 g/dL   AST 18 15 - 41 U/L   ALT 33 0 - 44 U/L   Alkaline Phosphatase 79 38 - 126 U/L   Total Bilirubin 1.0 0.3 - 1.2 mg/dL   GFR calc non Af Amer >60 >60 mL/min   GFR calc Af Amer >60 >60 mL/min   Anion gap 10 5 - 15    Comment: Performed at [facility], [place] [address] [place] [address]  CBC     Status: Abnormal   Collection Time: Jan 16, 2019  1:53 AM  Result Value Ref Range   WBC  10.3 4.0 - 10.5 K/uL   RBC 2.74 (L) 4.22 - 5.81 MIL/uL   Hemoglobin 8.7 (L) 13.0 - 17.0 g/dL   HCT 25.7 (L) 39.0 - 52.0 %  MCV 93.8 80.0 - 100.0 fL   MCH 31.8 26.0 - 34.0 pg   MCHC 33.9 30.0 - 36.0 g/dL   RDW 13.7 11.5 - 15.5 %   Platelets 258 150 - 400 K/uL   nRBC 0.0 0.0 - 0.2 %    Comment: Performed at [facility], [place] [address], [place], [place] [address]  Glucose, capillary     Status: Abnormal   Collection Time: 12/21/2018  7:05 AM  Result Value Ref Range   Glucose-Capillary 141 (H) 70 - 99 mg/dL    Dg Chest Port 1 View  Result Date: 12/16/2018 CLINICAL DATA:  Increased shortness of breath. EXAM: PORTABLE CHEST 1 VIEW COMPARISON:  CT scan November 08, 2018. FINDINGS: Cardiomediastinal silhouette is unremarkable given technique. Opacities are seen in the bases with some streaky/platelike come opponents. The lungs are otherwise clear. No other abnormalities. IMPRESSION: 1. Bibasilar opacities may represent atelectasis or developing infiltrate which could include pneumonia or aspiration. Recommend clinical correlation and follow-up to resolution. Electronically Signed   By: Dorise Bullion III M.D   On: 12/16/2018 14:52    FSF:SELTRVUY except as listed in admit H&P  Blood pressure 120/76, pulse 79, temperature 98.6 F (37 C), temperature source Oral, resp. rate 16, height 5\' 10"  (1.778 m), weight 98 kg, SpO2 94 %.  PHYSICAL EXAM: Overall appearance:  Healthy appearing, in no distress voice is very weak and breathy. Head:  Normocephalic, atraumatic. Ears: External ears look normal Nose: External nose is healthy in appearance. Internal nasal exam free of any lesions or obstruction. Oral Cavity/Pharynx:  There are no mucosal lesions or masses identified. Larynx/Hypopharynx: See below Neuro:  No identifiable neurologic deficits. Neck: No palpable neck masses.  Studies Reviewed: none  Procedures: Flexible fiberoptic laryngoscopy was performed following topical application of  Afrin/Xylocaine nasal spray.  Nasopharynx and nasal cavity is clear.  Oropharynx and hypopharynx are clear.  Larynx is healthy in appearance without any mucosal lesions except that the right vocal cord is paralyzed and in the paramedian position.   Assessment/Plan: Unilateral vocal cord paralysis.  He has not had any procedure done in the neck or chest.  This may be related to compression of the recurrent nerve as it enters into the larynx, secondary to pressure from the endotracheal tube cuff during the procedure.  This is a very rare complication.  There is a chance that this will improve over time.  We discussed 2 surgical options either endoscopic cord injections or external traditional laryngoplasty.  We will discuss again after he completes his swallowing evaluation.  Izora Gala 12-21-2018, 1:11 PM

## 2019-01-01 NOTE — Progress Notes (Signed)
Called to bedside for patient coding.    Pt just returned from barium swallow and was sitting on the side of the bed.  He was gurgling and then vomited brown emesis.  He eyes "then rolled back and he was unresponsive."  Nursing placed him back supine and couldn't palpate a pulse, so called a code 15:40.  He continued to have massive amounts of gastric contents coming up.    Code team responded and CPR was initiated immediately.   He was intubated and OG tube was placed.    He had PEA, asystole, and v fib throughout the code.    In total, he received 17 mg epi, 300 mg amiodarone, 2 amps bicarb, 2 mg magnesium sulfate, 1 gm calcium chloride.  He was quite pale and had bloody NGT output after several liters of brown output came out.  2 u pRBCs were given.   He was shocked 4 times when he was in v fib at pulse check.    He never had return of spontaneous circulation.    TOD called at 16:40.    Spoke with wife who is understandably very upset.     He has been hoarse and speech was consulted.  He was suspected to have a unilateral vocal cord issue which was confirmed by ENT with fiberoptic scope.  He went for barium swallow to quantify aspiration to help determine which path to take for repair.    Cause of death likely aspiration, although the bloody NGT output is also a concern.

## 2019-01-01 NOTE — Progress Notes (Signed)
Patient returned to unit from modified barrium swallow. At approximately 1520 patient called this RN for pain medications. I pulled medication and entered patient room. Patient was sitting in bed at this time and then had an episode of coughing. At this time patient sat up at side of bed and had emesis in emesis bag. I assisted patient however patient was not able to get all emesis out of his mouth. I provided the patient with oral suction set to assist with emesis. I called for another nurse at bedside to assist with the patient. The patient had further emesis onto floor. Patient assisted into bed with help of 2 other RN's. Rapid response called. Patient laying in bed started with agonal breathing and not responding. Code blue was called. The patients head of bed was lowered and immediately copious amounts of dark brown fluid come out of patient mouth and nose. Oral suction continued and compressions were initiated.

## 2019-01-01 NOTE — Discharge Summary (Signed)
Physician Discharge Summary  Patient ID: Joseph Massey MRN: [id number] DOB/AGE: [birth year] 68 y.o.  Admit date: 12/14/2018 Discharge date: 12/23/2018  Admission Diagnoses: Cholangiocarcinoma Hyperlipidemia FH hypertrophic cardiomyopathy Urticaria Gastritis Seasonal allergies OA R hip, shoulder, knee Gout HTN  Discharge Diagnoses:  Active Problems:  Pancreatic cancer ([place]), pT2N2 with positive margins.   Aspiration Right vocal cord paralysis Delayed gastric emptying Pancreatic leak Hypokalemia   Discharged Condition: deceased  Hospital Course:  Patient was admitted to the ICU following pancreaticoduodenectomy on 12/16/2018.  He did well overnight and trophic feeding was started via his J-tube.  Epidural is in place and working well.  He required some electrolyte replacement.  He is hoarse on postop day 1 but still had an NG tube in place.  She has had some bloating associated with the J-tube feeds so they were not advanced.  He was able to void with his Foley removed.  He was transferred to the floor.  On postop day 3 his NG tube was removed and he was able to do some steps.  His drain output was serosanguineous at that point.  He developed significant nausea on postop day before.  Was controlled with a scopolamine patch.  His hoarseness that was not improved with the NG tube removed.  He is able to get some sleep at that point Because of the nausea and the bloating, tube feeds were held.  After he had a little bit of nausea and vomiting, he complained of wheezing.  His voice is still quite hoarse at that point.  He also stated that he felt like he was aspirating.  Speech consult was obtained.  They recommended an ENT evaluation of his vocal cord given their evaluation.  He underwent fiberoptic laryngoscopy with Dr. Constance Holster and was seen to have a unilateral right vocal cord paralysis.  Tube feeds were restarted after the patient's bowel function improved.  Through this last day or 2 his  drain output of 1 of the drains became murky.  He was started on antibiotics.  There was a day when his drain output was so high that it popped off the bili drain.  It was nearly a liter at that point.  Because of the appearance, it was a presumed leak of pancreatic anastamosis.   On 12-31-2018 he was feeling relatively well that morning.  His nausea nearly resolved.  He was still hoarse.  A modified barium swallow was ordered to quantify the volume of aspiration to help assist with determination of what procedure would be best for him.  He came back up from a modified barium swallow and requested pain meds.  He started gurgling/coughing and then had emesis.  He was assisted into bed for vitals and slumped over becoming unresponsive.  Code called for lack of pulse or respirations.  He still had massive amount of emesis and then multiple liters of OGT output after placement.  The gastric contents did become bloody toward the end.   The code went for 1 hour and patient was never able to have return of spontaneous circulation despite witnessed arrest and immediate initiation of CPR.    Time of death was 4:40 pm.     Consults: ENT, nutrition  Significant Diagnostic Studies: labs: labs during code did not result until after he expired.  BMET showed CO 2 21 and glucose of 189.  Ca corrected to normal for albumin.  HCT was 21.4 down from 25.7 that morning, but he also received multiple liters of fluid in  between the labs.   Labs the morning of demise showed K 3.4, Gluc 149, Alb 2.4, WBCs 10.3, HCT 25.7.     Treatments: antibiotics: Zosyn and surgery: see above.    Discharge Exam: Blood pressure 122/90, pulse (!) 115, temperature 99 F (37.2 C), temperature source Tympanic, resp. rate 16, height 5\' 10"  (1.778 m), weight 98 kg, SpO2 94 %. General appearance: unresponsive Cardio: asystole GI: soft, distended, some bleeding around drains Extremities: pale  Disposition:  Funeral home.      Signed: Stark Klein 12/23/2018, 1:40 PM

## 2019-01-01 NOTE — Anesthesia Post-op Follow-up Note (Signed)
  Anesthesia Pain Follow-up Note  Patient: Joseph Massey  Day #: 8  Date of Follow-up: 12-19-18 Time: 10:50 AM  Last Vitals:  Vitals:   12/16/18 2143 19-Dec-2018 0508  BP: 122/85 120/76  Pulse: 80 79  Resp: 16 16  Temp: 36.9 C 37 C  SpO2: 93% 94%    Level of Consciousness: alert  Pain: mild   Side Effects:None  Catheter Site Exam:clean, dry, no drainage  Epidural / Intrathecal (From admission, onward)   Start     Dose/Rate Route Frequency Ordered Stop   12/18/2018 1545  ropivacaine (PF) 2 mg/mL (0.2%) (NAROPIN) injection     8 mL/hr 8 mL/hr  Epidural Continuous 12/13/2018 1534         Plan: Catheter removed/tip intact at surgeon's request, D/C Infusion at surgeon's request and D/C from anesthesia care at surgeon's request  [place]

## 2019-01-01 NOTE — Evaluation (Signed)
Clinical/Bedside Swallow Evaluation Patient Details  Name: Joseph Massey MRN: [id number] Date of Birth: July 06, 1950  Today's Date: Jan 12, 2019 Time: SLP Start Time (ACUTE ONLY): 0830 SLP Stop Time (ACUTE ONLY): 0850 SLP Time Calculation (min) (ACUTE ONLY): 20 min  Past Medical History:  Past Medical History:  Diagnosis Date  . Arthritis   . BPH (benign prostatic hyperplasia)   . Cancer ([place])   . ED (erectile dysfunction)   . Gout   . History of kidney stones   . Hyperlipidemia   . Hypertension   . Jaundice LAST 2 WEEKS  . Obesity   . Recurrent upper respiratory infection (URI)   . Recurrent urticaria 02/22/2018  . Sleep apnea    cpap  . UC (ulcerative colitis) ([place])   . Umbilical hernia    Past Surgical History:  Past Surgical History:  Procedure Laterality Date  . BILIARY BRUSHING  11/02/2018   Procedure: BILIARY BRUSHING;  Surgeon: Clarene Essex, MD;  Location: [facility];  Service: Endoscopy;;  . BILIARY STENT PLACEMENT N/A 11/02/2018   Procedure: BILIARY STENT PLACEMENT;  Surgeon: Clarene Essex, MD;  Location: [facility];  Service: Endoscopy;  Laterality: N/A;  . COLONOSCOPY N/A 02/15/2013   Procedure: COLONOSCOPY;  Surgeon: Garlan Fair, MD;  Location: [facility];  Service: Endoscopy;  Laterality: N/A;  . dental implants    . ERCP N/A 11/02/2018   Procedure: ENDOSCOPIC RETROGRADE CHOLANGIOPANCREATOGRAPHY (ERCP);  Surgeon: Clarene Essex, MD;  Location: [facility];  Service: Endoscopy;  Laterality: N/A;  . ESOPHAGOGASTRODUODENOSCOPY (EGD) WITH PROPOFOL N/A 11/02/2018   Procedure: ESOPHAGOGASTRODUODENOSCOPY (EGD) WITH PROPOFOL;  Surgeon: Clarene Essex, MD;  Location: [facility];  Service: Endoscopy;  Laterality: N/A;  . EUS N/A 11/02/2018   Procedure: FULL UPPER ENDOSCOPIC ULTRASOUND (EUS) RADIAL;  Surgeon: Clarene Essex, MD;  Location: [facility];  Service: Endoscopy;  Laterality: N/A;  . LAPAROSCOPY N/A 12/26/2018   Procedure: LAPAROSCOPY DIAGNOSTIC;  Surgeon: Stark Klein,  MD;  Location: [place];  Service: General;  Laterality: N/A;  EPIDURAL  . PANCREATIC STENT PLACEMENT  11/02/2018   Procedure: PANCREATIC STENT PLACEMENT;  Surgeon: Clarene Essex, MD;  Location: [facility];  Service: Endoscopy;;  . SINOSCOPY    . SPHINCTEROTOMY  11/02/2018   Procedure: SPHINCTEROTOMY;  Surgeon: Clarene Essex, MD;  Location: [facility];  Service: Endoscopy;;  . TONSILLECTOMY    . TOTAL HIP ARTHROPLASTY Right 07/14/2017   Procedure: RIGHT TOTAL HIP ARTHROPLASTY ANTERIOR APPROACH;  Surgeon: Renette Butters, MD;  Location: [place];  Service: Orthopedics;  Laterality: Right;  . WHIPPLE PROCEDURE N/A 12/11/2018   Procedure: WHIPPLE PROCEDURE WITH UMBILICAL HERNIA REPAIR;  Surgeon: Stark Klein, MD;  Location: [place];  Service: General;  Laterality: N/A;   HPI:  The patient is a 68 year old male who presents with a pancreatic mass. Pt is a 68 yo M who presented to PCP wtih jaundice.  He and his wife were on a walk and she noted that he was jaundiced.  She is a retired Nurse, children's.  Labs indicated elevated liver function tests.  Imaging showed dilated intra-and extrahepatic biliary dilatation.  He was also under work-up to get his right shoulder replaced.  This was put on hold.  Retrospectively, he has been noted that he has lost some weight.  Some of this was due to circumstances.  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  He has been out of work due to his shoulder for several months and has  been eating more healthy foods with his wife.  He has lost between 20 and 30 pounds, but much of this has been due to the above circumstances.  There have been times where he has not had quite as large of an appetite as he did previously.  He denies diabetes or steatorrhea.  He does not have any abdominal pain.  He was admitted for a WHIPPLE procedure and is s/p a 7.5 hour intubation for surgery.  His chest xray is showing bibasilar opacities questionable for atelectasis or  developing infiltrate which could include PNA or aspiration.     Assessment / Plan / Recommendation Clinical Impression  Clinical swallowing evaluation completed using thin liquids via spoon, nectar thick liquids via spoon and cup, pureed material and dry solids.  The patient is s/p an approximately 7.5 hour intubation for surgery.  Since extubation following surgery the patient endorses changes to his vocal quality.  Cranial nerve exam was completed and unremarkable.  Lingual, labial, facial and jaw range of motion and strength were adequate.  Sensation was intact.  Vocal quality was noted to be hoarse and breathy.  Spontaneous cough is breathy sounding.  He had poor ability to sustain /s/ and /z/ for more then 5 seconds.  His s/z ratio was 1.6 suggesting possible vocal fold pathology.  Suggest consideration of ENT consult for visualization of his vocal cords.  The patient presented with a possible pharyngeal dysphagia.  His oral phase appeared functional with timely mastication of dry solids and no oral residue seen post swallow.  Swallow trigger appreciated to palpation.  Immediate cough response seen given thin liquids via spoon even with use of compensatory maneuvers.  This was not seen given nectar thick liquids, pureed material or dry solids.  Recommend thicking liquids to nectar pending results of MBS later today.  Discussion with Dr. Barry Dienes whom is in agreement with the plan.   SLP Visit Diagnosis: Dysphagia, unspecified (R13.10)    Aspiration Risk  Moderate aspiration risk    Diet Recommendation  Nectar thick full liquid diet - can advance to regular per MD  Medication Administration: Whole meds with puree    Other  Recommendations Oral Care Recommendations: Oral care BID   Follow up Recommendations Other (comment)(TBD)             Prognosis Prognosis for Safe Diet Advancement: Fair      Swallow Study   General Date of Onset: 12/14/2018 HPI: The patient is a 68 year old male who  presents with a pancreatic mass. Pt is a 68 yo M who presented to PCP wtih jaundice.  He and his wife were on a walk and she noted that he was jaundiced.  She is a retired Nurse, children's.  Labs indicated elevated liver function tests.  Imaging showed dilated intra-and extrahepatic biliary dilatation.  He was also under work-up to get his right shoulder replaced.  This was put on hold.  Retrospectively, he has been noted that he has lost some weight.  Some of this was due to circumstances.  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  He has been out of work due to his shoulder for several months and has been eating more healthy foods with his wife.  He has lost between 20 and 30 pounds, but much of this has been due to the above circumstances.  There have been times where he has not had quite as large of an appetite as he  did previously.  He denies diabetes or steatorrhea.  He does not have any abdominal pain.  He was admitted for a WHIPPLE procedure and is s/p a 7.5 hour intubation for surgery.  His chest xray is showing bibasilar opacities questionable for atelectasis or developing infiltrate which could include PNA or aspiration.   Type of Study: Bedside Swallow Evaluation Previous Swallow Assessment: None noted at [facility].  Diet Prior to this Study: Other (Comment)(Full liquids) Temperature Spikes Noted: No History of Recent Intubation: Yes Length of Intubations (days): (~7.5 hours for surgery) Date extubated: 12/18/2018 Behavior/Cognition: Alert;Cooperative;Pleasant mood Oral Cavity Assessment: Within Functional Limits Oral Care Completed by SLP: No Oral Cavity - Dentition: Adequate natural dentition Vision: Functional for self-feeding Self-Feeding Abilities: Able to feed self Patient Positioning: Upright in bed Baseline Vocal Quality: Hoarse;Breathy Volitional Swallow: Able to elicit    Oral/Motor/Sensory Function Overall Oral Motor/Sensory Function: Within  functional limits   Ice Chips Ice chips: Not tested   Thin Liquid Thin Liquid: Impaired Presentation: Spoon Pharyngeal  Phase Impairments: Cough - Immediate    Nectar Thick Nectar Thick Liquid: Within functional limits Presentation: Cup;Spoon;Self Fed   Honey Thick Honey Thick Liquid: Not tested   Puree Puree: Within functional limits Presentation: Self Fed;Spoon   Solid     Solid: Within functional limits Presentation: Self Fed      Lamar Sprinkles 2018-12-29,9:14 AM

## 2019-01-01 NOTE — Code Documentation (Signed)
  Patient Name: KAID SEEBERGER   MRN: [id number]   Date of Birth/ Sex: 11/21/1950 , male      Admission Date: 12/05/2018  Attending Provider: Stark Klein, MD  Primary Diagnosis: Pancreatic cancer [facility]) [C25.9]   Indication: Pt was in his usual state of health until this PM, when he was noted to be gurgling and vomited brown emesis. Patient rolled back and became unresponsive. Code blue was subsequently called. At the time of arrival on scene, ACLS protocol was underway.   Technical Description:  - CPR performance duration:  60 minutes  - Was defibrillation or cardioversion used? Yes   - Was external pacer placed? No  - Was patient intubated pre/post CPR? Yes   Medications Administered: Y = Yes; Blank = No Amiodarone  Y  Atropine    Calcium  Y  Epinephrine  Y  Lidocaine    Magnesium  Y  Norepinephrine    Phenylephrine    Sodium bicarbonate  Y  Vasopressin    Other Y   Post CPR evaluation:  - Final Status - Was patient successfully resuscitated ? No   Miscellaneous Information:  - Time of death:  4:44 PM  - Primary team notified?  Yes  - Family Notified? To be notified by primary team.      Madalyn Rob, MD   2019-01-16, 4:52 PM

## 2019-01-01 DEATH — deceased

## 2021-01-19 IMAGING — CT CT CHEST WITH CONTRAST
2 of 4 series · 11 of 36 positions shown, 13 images · IV contrast (iopamidol)
Comparison: None.

CLINICAL DATA: Preop for Whipple procedure, jaundice.

EXAM:
CT CHEST WITH CONTRAST
TECHNIQUE: Multidetector CT imaging of the chest was performed during
intravenous contrast administration.
CONTRAST:  75mL MBTFZ2-IYY IOPAMIDOL (MBTFZ2-IYY) INJECTION 61%

[Series 2: chest 2.00 br40 s3 ax · axial · 0.63mm/px · z∈[+1562,+1870]mm · 8 of 184 slices shown, 10 images]
[im 15/184  mediastinal]
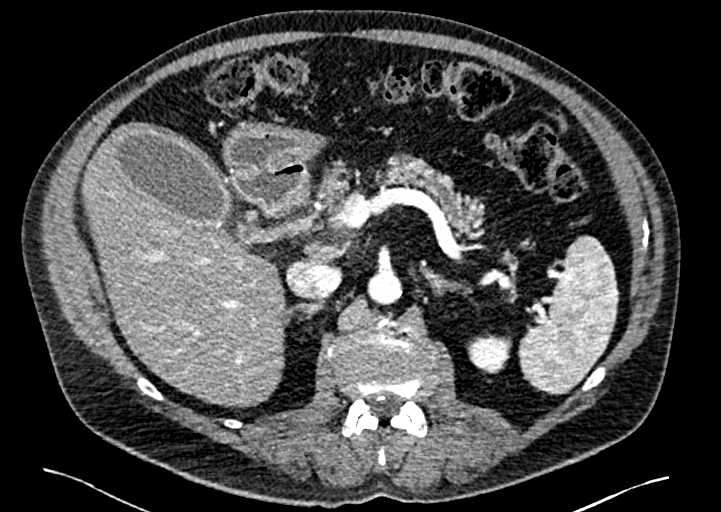
[im 15/184  lung]
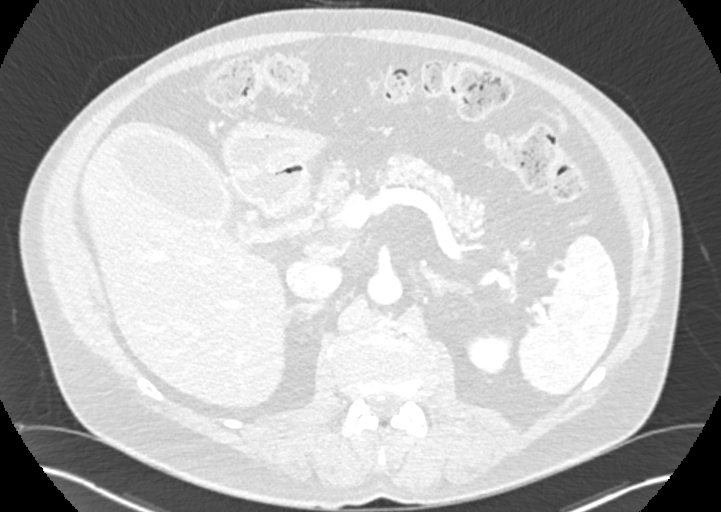
[im 43/184  lung]
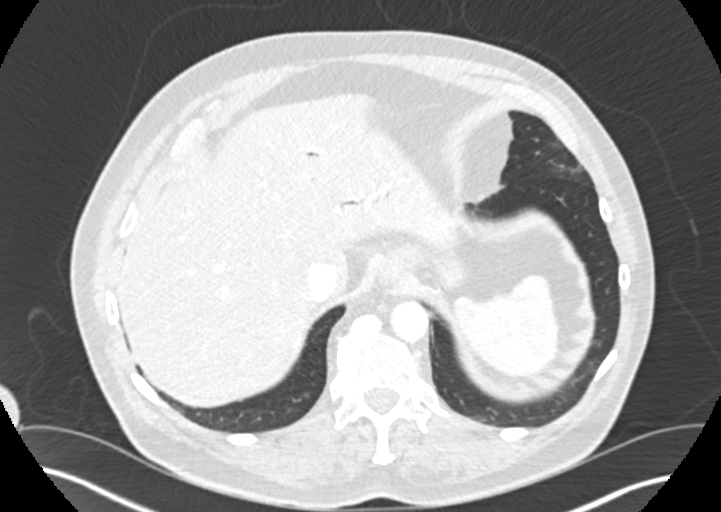
[im 57/184  lung]
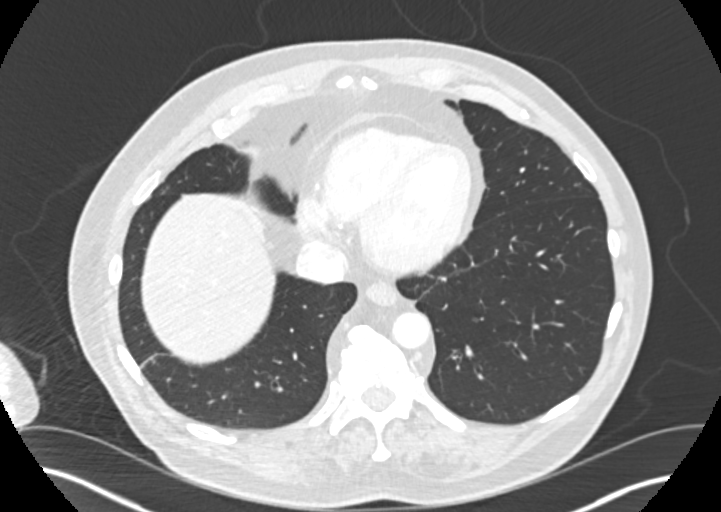
[im 85/184  lung]
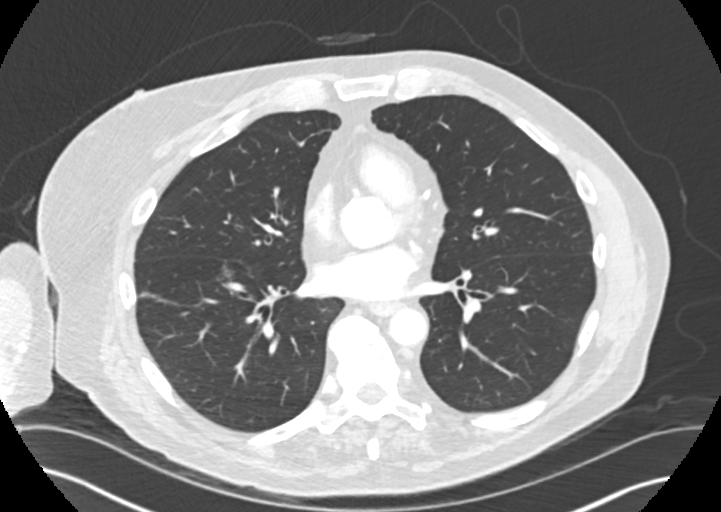
[im 99/184  mediastinal]
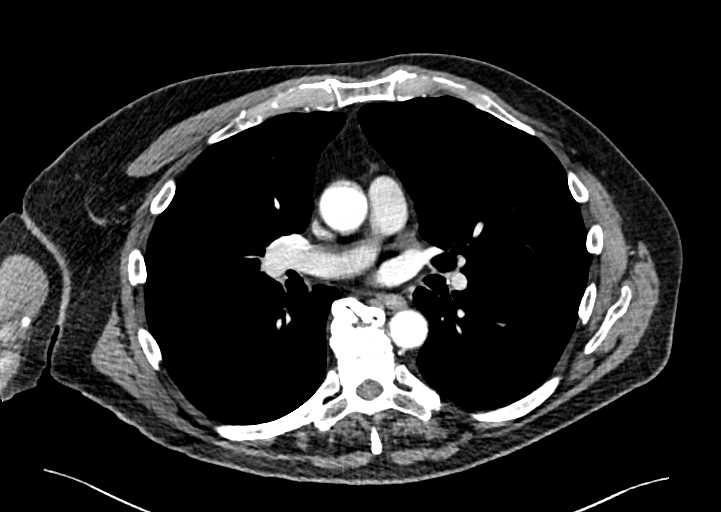
[im 99/184  lung]
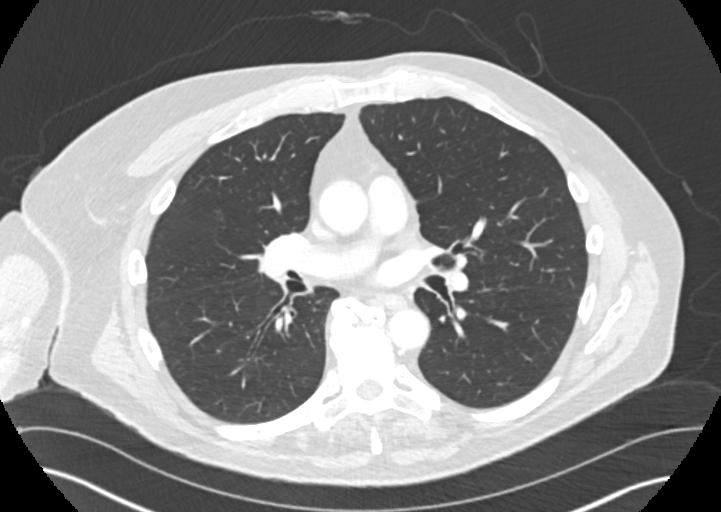
[im 127/184  lung]
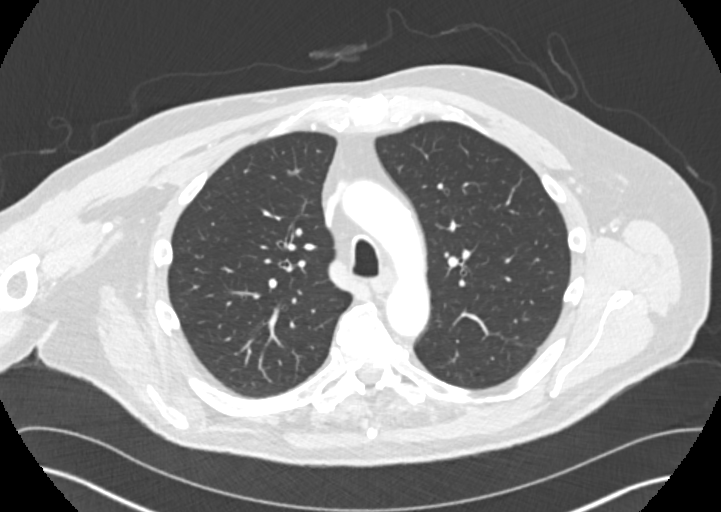
[im 141/184  lung]
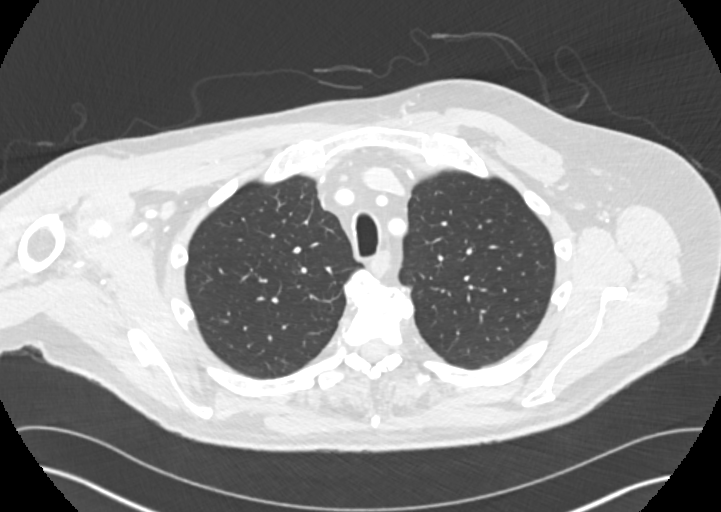
[im 169/184  lung]
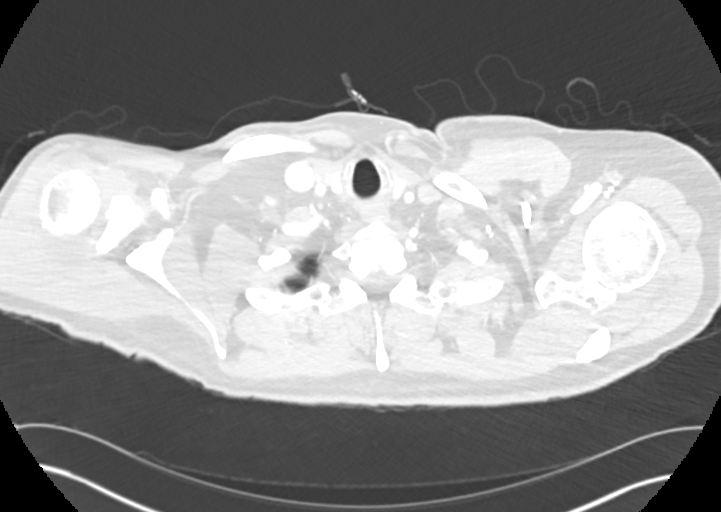

[Series 4: chest 2.00 br40 s3 cor · coronal · 0.72mm/px · 3 of 161 slices shown]
[im 33/161  lung]
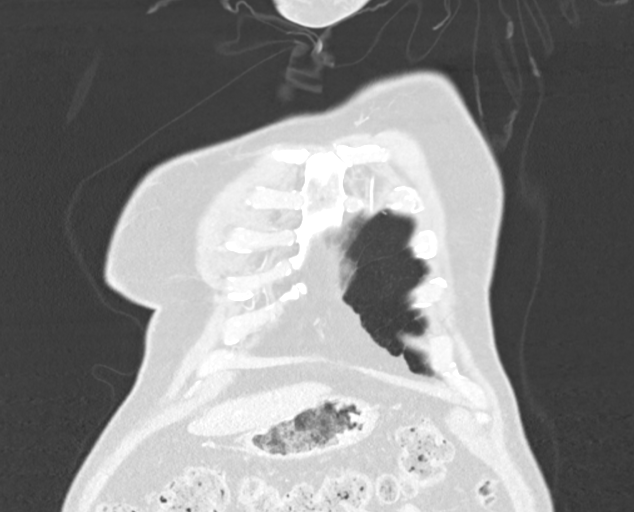
[im 65/161  lung]
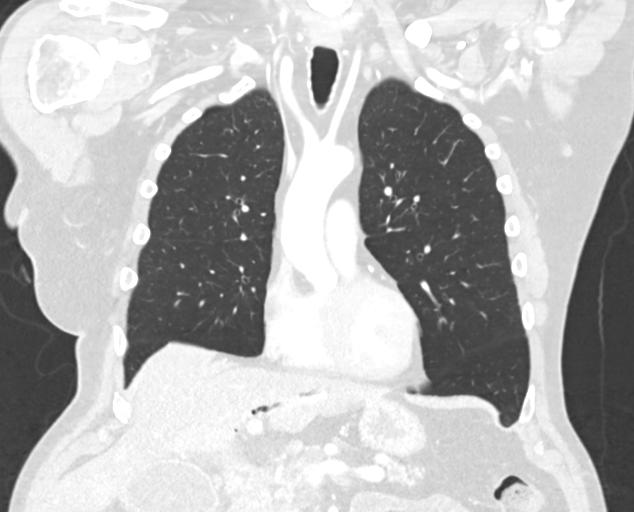
[im 97/161  lung]
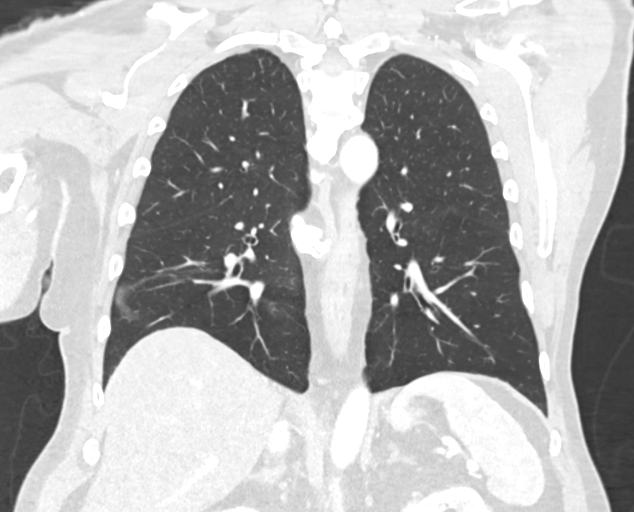

[11 of 36 positions shown; findings below may reference images not displayed]

FINDINGS: Cardiovascular: Atherosclerosis of thoracic aorta is noted without
aneurysm or dissection. Normal cardiac size. No pericardial
effusion. Mild coronary artery calcifications are noted.

Mediastinum/Nodes: No enlarged mediastinal, hilar, or axillary lymph
nodes. Thyroid gland, trachea, and esophagus demonstrate no
significant findings.

Lungs/Pleura: Lungs are clear. No pleural effusion or pneumothorax.

Upper Abdomen: Common bile duct stent is noted. Hepatic steatosis is
noted.

Musculoskeletal: No chest wall abnormality. No acute or significant
osseous findings.
IMPRESSION: No acute abnormality is noted in the chest.

Mild coronary artery calcifications are noted.

Hepatic steatosis is noted.

Aortic Atherosclerosis ([id number].I).

## 2021-01-19 IMAGING — DX ABDOMEN - 2 VIEW
3 series · 3 of 3 positions shown · non-contrast
Comparison: 11/02/18

CLINICAL DATA: Check pancreatic duct stent placement

EXAM:
ABDOMEN - 2 VIEW

[dg abd 2 views (1 of 3)]
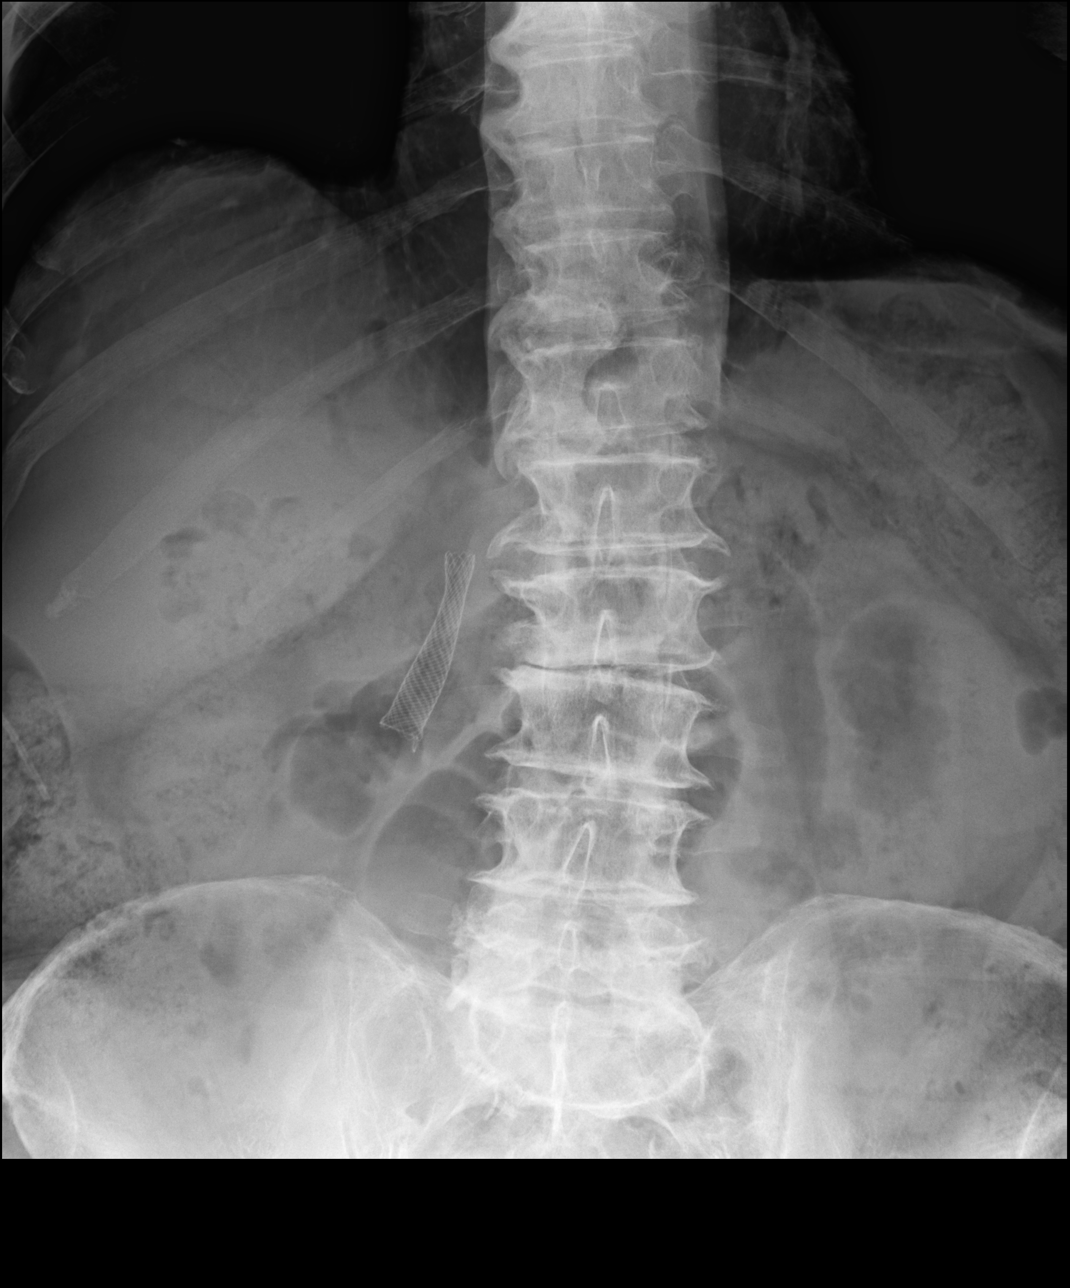

[dg abd 2 views (2 of 3)]
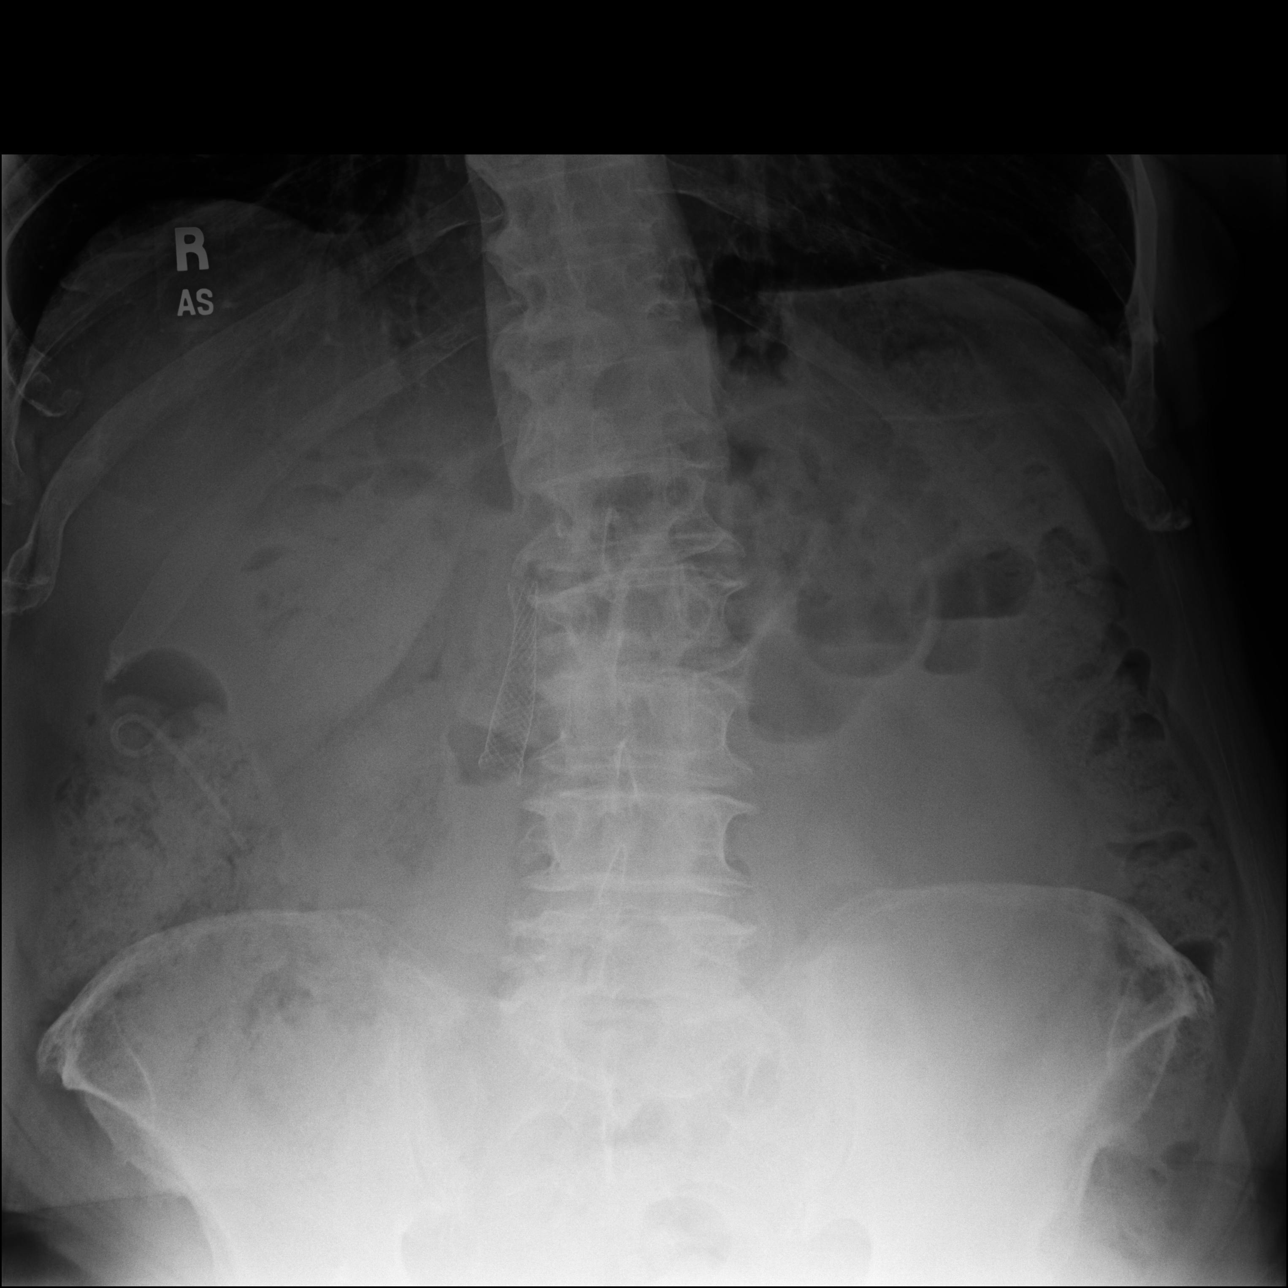

[dg abd 2 views (3 of 3)]
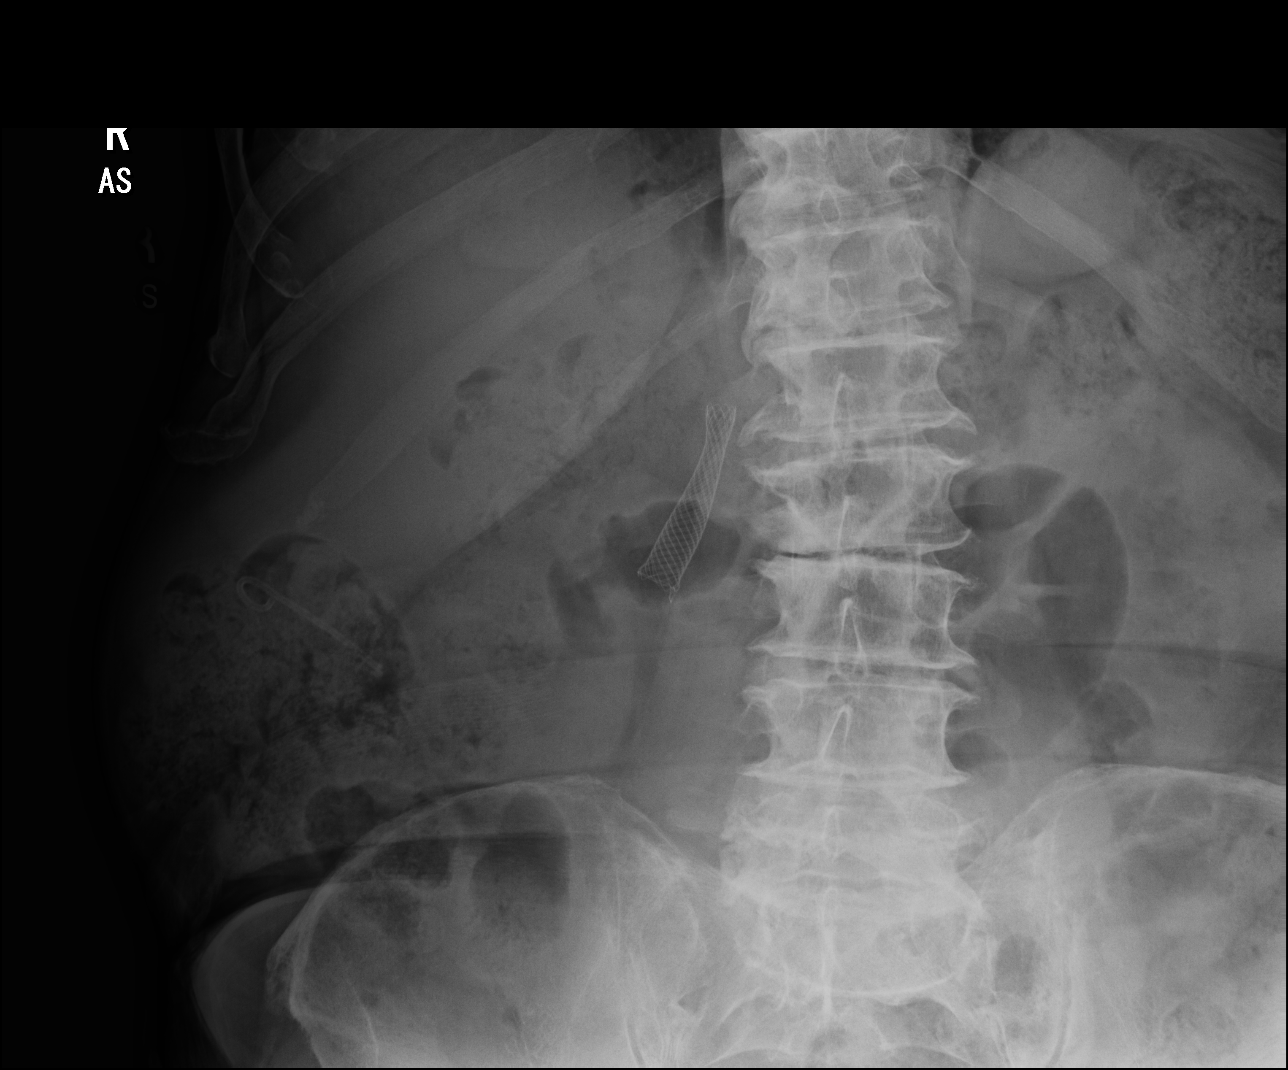

[3 of 3 positions shown; findings below may reference images not displayed]

FINDINGS: Scattered large and small bowel gas is noted. The small pigtail
stent previously noted within the pancreatic duct has migrated into
the ascending colon. A metallic common bile duct stent remains in
place and slightly better expanded than that seen on prior ERCP. No
free air is noted. Some changes of mild constipation are seen.
IMPRESSION: Pancreatic stent has migrated into the ascending colon.

Metallic common bile duct stent is again noted and stable.

Mild constipation.
